# Patient Record
Sex: Female | Born: 1980 | Race: White | Hispanic: No | Marital: Married | Smoking: Never smoker
Health system: Southern US, Community
[De-identification: ages and names within clinical notes are randomized; demographics above are authoritative.]

## PROBLEM LIST (undated history)

## (undated) DIAGNOSIS — Z789 Other specified health status: Secondary | ICD-10-CM

## (undated) HISTORY — PX: MANDIBLE FRACTURE SURGERY: SHX706

## (undated) HISTORY — PX: CHOLECYSTECTOMY: SHX55

---
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
## 2001-12-05 ENCOUNTER — Emergency Department (HOSPITAL_COMMUNITY): Admission: EM | Admit: 2001-12-05 | Discharge: 2001-12-06 | Payer: Self-pay | Admitting: Emergency Medicine

## 2001-12-18 ENCOUNTER — Other Ambulatory Visit: Admission: RE | Admit: 2001-12-18 | Discharge: 2001-12-18 | Payer: Self-pay | Admitting: Obstetrics and Gynecology

## 2002-01-14 ENCOUNTER — Ambulatory Visit (HOSPITAL_COMMUNITY): Admission: RE | Admit: 2002-01-14 | Discharge: 2002-01-14 | Payer: Self-pay | Admitting: Gastroenterology

## 2002-01-14 ENCOUNTER — Encounter: Payer: Self-pay | Admitting: Gastroenterology

## 2002-02-26 ENCOUNTER — Encounter: Payer: Self-pay | Admitting: Surgery

## 2002-02-26 ENCOUNTER — Observation Stay (HOSPITAL_COMMUNITY): Admission: RE | Admit: 2002-02-26 | Discharge: 2002-02-27 | Payer: Self-pay | Admitting: Surgery

## 2002-02-26 ENCOUNTER — Encounter (INDEPENDENT_AMBULATORY_CARE_PROVIDER_SITE_OTHER): Payer: Self-pay | Admitting: Specialist

## 2003-02-01 ENCOUNTER — Emergency Department (HOSPITAL_COMMUNITY): Admission: EM | Admit: 2003-02-01 | Discharge: 2003-02-01 | Payer: Self-pay | Admitting: Emergency Medicine

## 2003-02-02 ENCOUNTER — Emergency Department (HOSPITAL_COMMUNITY): Admission: EM | Admit: 2003-02-02 | Discharge: 2003-02-02 | Payer: Self-pay | Admitting: Emergency Medicine

## 2007-09-02 ENCOUNTER — Encounter: Admission: RE | Admit: 2007-09-02 | Discharge: 2007-09-02 | Payer: Self-pay | Admitting: Obstetrics and Gynecology

## 2010-10-22 NOTE — L&D Delivery Note (Signed)
Delivery Note At 9:22 AM a viable femal was delivered via Vaginal, Spontaneous Delivery in OA Presentation.  APGAR: 9 9 Placenta status delivered spontaneously and intact with a 3 vessel cord.  Anesthesia: Epidural  Episiotomy: none Lacerations: first degree Suture Repair: 3.0 chromic Est. Blood Loss (mL): 300  Mom to postpartum.  Baby to nursery-stable.  Shuaib Corsino L 09/21/2011, 9:32 AM

## 2011-03-09 NOTE — Op Note (Signed)
[facility]  Patient:    Maria Huynh, Maria Huynh Visit Number: [id number] MRN: [id number]          Service Type: SUR Location: [id number] Attending Physician:  Shelly Rubenstein Dictated by:   Abigail Miyamoto, M.D. Proc. Date: 02/26/02 Admit Date:  02/26/2002                             Operative Report  PREOPERATIVE DIAGNOSIS:  Biliary dyskinesia.  POSTOPERATIVE DIAGNOSIS:  Biliary dyskinesia.  OPERATION PERFORMED:  Laparoscopic cholecystectomy with intraoperative cholangiogram.  SURGEON:  Abigail Miyamoto, M.D.  ASSISTANT:  Adolph Pollack, M.D.  ANESTHESIA:  General endotracheal anesthesia.  ESTIMATED BLOOD LOSS:  Minimal.  OPERATIVE FINDINGS:  The patient was found to have a normal cholangiogram.  DESCRIPTION OF PROCEDURE:  Patient brought to operating room and identified as Maria Huynh.  She was placed supine on the operating table and general anesthesia was induced.  Her abdomen was then prepped and draped in the usual sterile fashion.  Using a #15 blade, a small transverse incision was made below the umbilicus.  Incision was carried down through the fascia with the scalpel.  A hemostat was then used to pass into the peritoneal cavity.  A 0 Vicryl pursestring suture was placed around the fascia opening.  The Hasson port was placed through the opening and insufflation of the abdomen was begun. An 11 mm port was then placed in the patients epigastrium and two 5 mm ports were placed in his right flank, all under direct vision.  The gallbladder was then retracted above the liver bed.  Dissection was then carried out at the base.  There was found to be a moderate amount of scarring to the gallbladder. The cystic duct was dissected out and clipped once distally.  It was then partly transected with scissors.  The cholangiocath was inserted through an angiocath in the right upper quadrant.  The cholangiocath was passed down into the cystic  stump.  A cholangiogram was then performed under fluoroscopy.  Good flow of contrast was seen into the common bile duct, biliary radical and duodenum.  The biliary system appeared normal.  At this point the cholangiocath was removed.  The cystic duct was then clipped three times proximally and then completely transected.  The cystic artery and the branch were then identified, clipped twice proximally, once distally and transected as well.  The gallbladder was then dissected free from the liver bed with the electrocautery.  Once the gallbladder was completely excised, it was removed through the incision in the umbilicus.  The 0 Vicryl suture at the umbilicus was then tied in place closing the fascial defect.  The liver bed was then again examined and hemostasis was achieved with the cautery.  The abdomen was then irrigated with normal saline.  All ports were then removed under direct vision and the abdomen was deflated.  All incisions were then anesthetized with 0.25% Marcaine and then closed with 4-0 Monocryl subcuticular sutures. Steri-Strips, gauze and tape were then applied.  The patient tolerated the procedure well.  All, sponge, needle and instrument counts were correct at the end of the procedure.  The patient was then extubated in the operating room and taken in stable condition to the recovery room. Dictated by:   Abigail Miyamoto, M.D. Attending Physician:  Shelly Rubenstein DD:  02/26/02 TD:  02/27/02 Job: [id number]

## 2011-09-11 ENCOUNTER — Encounter (HOSPITAL_COMMUNITY): Payer: Self-pay | Admitting: *Deleted

## 2011-09-11 ENCOUNTER — Inpatient Hospital Stay (HOSPITAL_COMMUNITY)
Admission: AD | Admit: 2011-09-11 | Discharge: 2011-09-11 | Disposition: A | Discharge status: Home / Self Care | Payer: Managed Care, Other (non HMO) | Source: Ambulatory Visit | Attending: Obstetrics and Gynecology | Admitting: Obstetrics and Gynecology

## 2011-09-11 DIAGNOSIS — O479 False labor, unspecified: Secondary | ICD-10-CM | POA: Insufficient documentation

## 2011-09-11 DIAGNOSIS — O4690 Antepartum hemorrhage, unspecified, unspecified trimester: Secondary | ICD-10-CM | POA: Insufficient documentation

## 2011-09-11 HISTORY — DX: Other specified health status: Z78.9

## 2011-09-11 NOTE — Progress Notes (Signed)
Pt states, " I went to the doctor today at 3:50pm and was 1 cm. After I got home  At 6:30 pm I saw blood in my panties and some clots an inch long. I've had contractions on and off for a couple of days."

## 2011-09-20 ENCOUNTER — Encounter (HOSPITAL_COMMUNITY): Payer: Self-pay | Admitting: Anesthesiology

## 2011-09-20 ENCOUNTER — Inpatient Hospital Stay (HOSPITAL_COMMUNITY): Payer: Managed Care, Other (non HMO) | Admitting: Anesthesiology

## 2011-09-20 ENCOUNTER — Inpatient Hospital Stay (HOSPITAL_COMMUNITY)
Admission: AD | Admit: 2011-09-20 | Discharge: 2011-09-23 | DRG: 775 | Disposition: A | Discharge status: Home / Self Care | Payer: Managed Care, Other (non HMO) | Source: Ambulatory Visit | Attending: Obstetrics and Gynecology | Admitting: Obstetrics and Gynecology

## 2011-09-20 ENCOUNTER — Encounter (HOSPITAL_COMMUNITY): Payer: Self-pay

## 2011-09-20 LAB — CBC
HCT: 37.1 % (ref 36.0–46.0)
Hemoglobin: 12.1 g/dL (ref 12.0–15.0)
MCH: 30.4 pg (ref 26.0–34.0)
MCHC: 32.6 g/dL (ref 30.0–36.0)
RDW: 14.6 % (ref 11.5–15.5)

## 2011-09-20 LAB — RPR: RPR: NONREACTIVE

## 2011-09-20 LAB — ANTIBODY SCREEN: Antibody Screen: NEGATIVE

## 2011-09-20 LAB — STREP B DNA PROBE: GBS: NEGATIVE

## 2011-09-20 LAB — RUBELLA ANTIBODY, IGM: Rubella: IMMUNE

## 2011-09-20 MED ORDER — PHENYLEPHRINE 40 MCG/ML (10ML) SYRINGE FOR IV PUSH (FOR BLOOD PRESSURE SUPPORT)
80.0000 ug | PREFILLED_SYRINGE | INTRAVENOUS | Status: DC | PRN
  Filled 2011-09-20: qty 5

## 2011-09-20 MED ORDER — LACTATED RINGERS IV SOLN: 500.0000 mL | INTRAVENOUS | Status: DC | PRN

## 2011-09-20 MED ORDER — ONDANSETRON HCL 4 MG/2ML IJ SOLN: 4.0000 mg | Freq: Four times a day (QID) | INTRAMUSCULAR | Status: DC | PRN

## 2011-09-20 MED ORDER — CITRIC ACID-SODIUM CITRATE 334-500 MG/5ML PO SOLN: 30.0000 mL | ORAL | Status: DC | PRN

## 2011-09-20 MED ORDER — EPHEDRINE 5 MG/ML INJ
10.0000 mg | INTRAVENOUS | Status: DC | PRN
  Filled 2011-09-20: qty 4

## 2011-09-20 MED ORDER — SODIUM BICARBONATE 8.4 % IV SOLN
INTRAVENOUS | Status: DC | PRN
  Administered 2011-09-20: 5 mL via EPIDURAL

## 2011-09-20 MED ORDER — IBUPROFEN 600 MG PO TABS: 600.0000 mg | ORAL_TABLET | Freq: Four times a day (QID) | ORAL | Status: DC | PRN

## 2011-09-20 MED ORDER — OXYTOCIN 20 UNITS IN LACTATED RINGERS INFUSION - SIMPLE
125.0000 mL/h | Freq: Once | INTRAVENOUS | Status: AC
  Administered 2011-09-21: 900 mL/h via INTRAVENOUS

## 2011-09-20 MED ORDER — LIDOCAINE HCL (PF) 1 % IJ SOLN
30.0000 mL | INTRAMUSCULAR | Status: DC | PRN
  Filled 2011-09-20: qty 30

## 2011-09-20 MED ORDER — DIPHENHYDRAMINE HCL 50 MG/ML IJ SOLN: 12.5000 mg | INTRAMUSCULAR | Status: DC | PRN

## 2011-09-20 MED ORDER — EPHEDRINE 5 MG/ML INJ: 10.0000 mg | INTRAVENOUS | Status: DC | PRN

## 2011-09-20 MED ORDER — PHENYLEPHRINE 40 MCG/ML (10ML) SYRINGE FOR IV PUSH (FOR BLOOD PRESSURE SUPPORT): 80.0000 ug | PREFILLED_SYRINGE | INTRAVENOUS | Status: DC | PRN

## 2011-09-20 MED ORDER — OXYTOCIN BOLUS FROM INFUSION
500.0000 mL | Freq: Once | INTRAVENOUS | Status: DC
  Filled 2011-09-20: qty 500
  Filled 2011-09-20: qty 1000

## 2011-09-20 MED ORDER — LACTATED RINGERS IV SOLN
INTRAVENOUS | Status: DC
  Administered 2011-09-20: 17:00:00 via INTRAVENOUS

## 2011-09-20 MED ORDER — FLEET ENEMA 7-19 GM/118ML RE ENEM: 1.0000 | ENEMA | RECTAL | Status: DC | PRN

## 2011-09-20 MED ORDER — LACTATED RINGERS IV SOLN
500.0000 mL | Freq: Once | INTRAVENOUS | Status: AC
  Administered 2011-09-20: 500 mL via INTRAVENOUS

## 2011-09-20 MED ORDER — ACETAMINOPHEN 325 MG PO TABS: 650.0000 mg | ORAL_TABLET | ORAL | Status: DC | PRN

## 2011-09-20 MED ORDER — OXYCODONE-ACETAMINOPHEN 5-325 MG PO TABS: 2.0000 | ORAL_TABLET | ORAL | Status: DC | PRN

## 2011-09-20 MED ORDER — FENTANYL 2.5 MCG/ML BUPIVACAINE 1/10 % EPIDURAL INFUSION (WH - ANES)
14.0000 mL/h | INTRAMUSCULAR | Status: DC
  Administered 2011-09-21 (×3): 14 mL/h via EPIDURAL
  Filled 2011-09-20 (×4): qty 60

## 2011-09-20 MED ORDER — BUTORPHANOL TARTRATE 2 MG/ML IJ SOLN: 1.0000 mg | INTRAMUSCULAR | Status: DC | PRN

## 2011-09-20 MED ORDER — OXYTOCIN 20 UNITS IN LACTATED RINGERS INFUSION - SIMPLE
1.0000 m[IU]/min | INTRAVENOUS | Status: DC
  Administered 2011-09-20: 1 m[IU]/min via INTRAVENOUS

## 2011-09-20 MED ORDER — TERBUTALINE SULFATE 1 MG/ML IJ SOLN: 0.2500 mg | Freq: Once | INTRAMUSCULAR | Status: AC | PRN

## 2011-09-20 MED ORDER — FENTANYL 2.5 MCG/ML BUPIVACAINE 1/10 % EPIDURAL INFUSION (WH - ANES)
INTRAMUSCULAR | Status: DC | PRN
  Administered 2011-09-20: 14 mL/h via EPIDURAL

## 2011-09-20 NOTE — H&P (Signed)
Maria Huynh is a 30 y.o. female presenting for SRjOM clear about noon followed by UCs. No HA, vision change or epigastric pain. Maternal Medical History:  Reason for admission: Reason for admission: rupture of membranes.  Contractions: Onset was 3-5 hours ago.    Fetal activity: Perceived fetal activity is normal.      OB History    Grav Para Term Preterm Abortions TAB SAB Ect Mult Living   1              Past Medical History  Diagnosis Date  . No pertinent past medical history    Past Surgical History  Procedure Date  . Cholecystectomy    Family History: family history includes Hypertension in her mother and Kidney disease in her mother. Social History:  reports that she has never smoked. She has never used smokeless tobacco. She reports that she does not drink alcohol or use illicit drugs.  Review of Systems  Eyes: Negative for blurred vision.  Gastrointestinal: Negative for abdominal pain.  Neurological: Negative for headaches.    Dilation: 3 Effacement (%): 50 Station: -2 Exam by:: L. MCDaniel RN Blood pressure 118/74, pulse 105, temperature 98.1 F (36.7 C), temperature source Oral, resp. rate 18, height 5\' 5"  (1.651 m), weight 90.266 kg (199 lb). Maternal Exam:  Uterine Assessment: Contraction strength is moderate.  Contraction frequency is irregular.      Fetal Exam Fetal Monitor Review: Pattern: accelerations present.       Physical Exam  Cardiovascular: Normal rate and regular rhythm.   Respiratory: Effort normal and breath sounds normal.  GI: There is no tenderness.  Neurological: She has normal reflexes.    Prenatal labs: ABO, Rh: A/Negative/-- (11/29 0000) Antibody: Negative (11/29 0000) Rubella: Immune (11/29 0000) RPR: Nonreactive (11/29 0000)  HBsAg: Negative (11/29 0000)  HIV: Non-reactive (11/29 0000)  GBS: Negative (11/29 0000)   Assessment/Plan: 30 yo G1P0 at 53 3/7 weeks with SROM and UCs about q3-5 min. Pitocin now running.  D/W pt labor augmentation, risks/benefits.   Shaquetta Arcos II,Tannon Peerson E 09/20/2011, 5:09 PM

## 2011-09-20 NOTE — Anesthesia Preprocedure Evaluation (Signed)

## 2011-09-20 NOTE — ED Notes (Signed)
Dr. Henderson Cloud notified pt +SROM at 1200 verified by ferning slide, clear fluid, rates pain 4/10. G1, per pt GBS negative, EFM tracing reactive, ctx's q58minutes. Dr. Henderson Cloud to place orders from MD office for admission.

## 2011-09-20 NOTE — Anesthesia Procedure Notes (Signed)

## 2011-09-20 NOTE — Progress Notes (Signed)
Pt states ?SROm at 1200 today, clear fluid, G1, was 2cm in office Tuesday, +FM, lower back pain and vaginal pain rates 4/10.

## 2011-09-21 ENCOUNTER — Encounter (HOSPITAL_COMMUNITY): Payer: Self-pay | Admitting: *Deleted

## 2011-09-21 MED ORDER — MEDROXYPROGESTERONE ACETATE 150 MG/ML IM SUSP: 150.0000 mg | INTRAMUSCULAR | Status: DC | PRN

## 2011-09-21 MED ORDER — OXYCODONE-ACETAMINOPHEN 5-325 MG PO TABS: 1.0000 | ORAL_TABLET | ORAL | Status: DC | PRN

## 2011-09-21 MED ORDER — WITCH HAZEL-GLYCERIN EX PADS: 1.0000 "application " | MEDICATED_PAD | CUTANEOUS | Status: DC | PRN

## 2011-09-21 MED ORDER — IBUPROFEN 600 MG PO TABS
600.0000 mg | ORAL_TABLET | Freq: Four times a day (QID) | ORAL | Status: DC
  Administered 2011-09-21 – 2011-09-23 (×7): 600 mg via ORAL
  Filled 2011-09-21 (×7): qty 1

## 2011-09-21 MED ORDER — SENNOSIDES-DOCUSATE SODIUM 8.6-50 MG PO TABS
2.0000 | ORAL_TABLET | Freq: Every day | ORAL | Status: DC
  Administered 2011-09-21 – 2011-09-22 (×2): 2 via ORAL

## 2011-09-21 MED ORDER — DIBUCAINE 1 % RE OINT
1.0000 "application " | TOPICAL_OINTMENT | RECTAL | Status: DC | PRN
  Filled 2011-09-21: qty 28

## 2011-09-21 MED ORDER — BENZOCAINE-MENTHOL 20-0.5 % EX AERO
1.0000 "application " | INHALATION_SPRAY | CUTANEOUS | Status: DC | PRN
  Administered 2011-09-21: 1 via TOPICAL
  Filled 2011-09-21: qty 56

## 2011-09-21 MED ORDER — MEASLES, MUMPS & RUBELLA VAC ~~LOC~~ INJ
0.5000 mL | INJECTION | Freq: Once | SUBCUTANEOUS | Status: DC
  Filled 2011-09-21: qty 0.5

## 2011-09-21 MED ORDER — DIPHENHYDRAMINE HCL 25 MG PO CAPS: 25.0000 mg | ORAL_CAPSULE | Freq: Four times a day (QID) | ORAL | Status: DC | PRN

## 2011-09-21 MED ORDER — SIMETHICONE 80 MG PO CHEW: 80.0000 mg | CHEWABLE_TABLET | ORAL | Status: DC | PRN

## 2011-09-21 MED ORDER — ONDANSETRON HCL 4 MG PO TABS: 4.0000 mg | ORAL_TABLET | ORAL | Status: DC | PRN

## 2011-09-21 MED ORDER — LANOLIN HYDROUS EX OINT: TOPICAL_OINTMENT | CUTANEOUS | Status: DC | PRN

## 2011-09-21 MED ORDER — BISACODYL 10 MG RE SUPP
10.0000 mg | Freq: Every day | RECTAL | Status: DC | PRN
  Filled 2011-09-21: qty 1

## 2011-09-21 MED ORDER — BENZOCAINE-MENTHOL 20-0.5 % EX AERO
INHALATION_SPRAY | CUTANEOUS | Status: AC
  Administered 2011-09-21: 1 via TOPICAL
  Filled 2011-09-21: qty 56

## 2011-09-21 MED ORDER — TETANUS-DIPHTH-ACELL PERTUSSIS 5-2.5-18.5 LF-MCG/0.5 IM SUSP
0.5000 mL | Freq: Once | INTRAMUSCULAR | Status: DC
  Filled 2011-09-21: qty 0.5

## 2011-09-21 MED ORDER — ZOLPIDEM TARTRATE 5 MG PO TABS: 5.0000 mg | ORAL_TABLET | Freq: Every evening | ORAL | Status: DC | PRN

## 2011-09-21 MED ORDER — ONDANSETRON HCL 4 MG/2ML IJ SOLN: 4.0000 mg | INTRAMUSCULAR | Status: DC | PRN

## 2011-09-21 MED ORDER — FLEET ENEMA 7-19 GM/118ML RE ENEM: 1.0000 | ENEMA | Freq: Every day | RECTAL | Status: DC | PRN

## 2011-09-21 MED ORDER — PRENATAL PLUS 27-1 MG PO TABS
1.0000 | ORAL_TABLET | Freq: Every day | ORAL | Status: DC
  Administered 2011-09-22 – 2011-09-23 (×2): 1 via ORAL
  Filled 2011-09-21 (×2): qty 1

## 2011-09-21 NOTE — Progress Notes (Signed)
FHT sleep pattern now, 10-15 BPM response to scalp stim UCs q 3 min Cx ant rim from 9:00 to 12:00/ 0 station

## 2011-09-21 NOTE — Anesthesia Postprocedure Evaluation (Signed)
Anesthesia Post Note  Patient: Maria Huynh  Procedure(s) Performed: * No procedures listed *  Anesthesia type: Epidural  Patient location: Mother/Baby  Post pain: Pain level controlled  Post assessment: Post-op Vital signs reviewed  Last Vitals:  Filed Vitals:   09/21/11 1345  BP: 117/71  Pulse: 127  Temp: 37.7 C  Resp: 18    Post vital signs: Reviewed  Level of consciousness:alert  Complications: No apparent anesthesia complications

## 2011-09-21 NOTE — Progress Notes (Signed)
Patient is pushing now  Exam c/c/+1 some caput Continue pushing

## 2011-09-22 LAB — CBC
MCHC: 31.9 g/dL (ref 30.0–36.0)
RDW: 15 % (ref 11.5–15.5)
WBC: 18.8 10*3/uL — ABNORMAL HIGH (ref 4.0–10.5)

## 2011-09-22 MED ORDER — RHO D IMMUNE GLOBULIN 1500 UNIT/2ML IJ SOLN
300.0000 ug | Freq: Once | INTRAMUSCULAR | Status: AC
  Administered 2011-09-22: 300 ug via INTRAMUSCULAR
  Filled 2011-09-22: qty 2

## 2011-09-22 NOTE — Progress Notes (Signed)
Post Partum Day 1 Subjective: no complaints, up ad lib, voiding, tolerating PO and + flatus  Objective: Blood pressure 110/73, pulse 101, temperature 98.2 F (36.8 C), temperature source Oral, resp. rate 18, height 5\' 5"  (1.651 m), weight 90.266 kg (199 lb), SpO2 94.00%, unknown if currently breastfeeding.  Physical Exam:  General: alert, cooperative and appears stated age Lochia: appropriate Uterine Fundus: firm Incision: not applicable DVT Evaluation: No evidence of DVT seen on physical exam.   Basename 09/22/11 0537 09/20/11 1620  HGB 9.5* 12.1  HCT 29.8* 37.1    Assessment/Plan: Plan for discharge tomorrow   LOS: 2 days   Maria Huynh L 09/22/2011, 10:28 AM

## 2011-09-23 LAB — RH IG WORKUP (INCLUDES ABO/RH)
ABO/RH(D): A NEG
Antibody Screen: NEGATIVE
Gestational Age(Wks): 38.4

## 2011-09-23 MED ORDER — BENZOCAINE-MENTHOL 20-0.5 % EX AERO
INHALATION_SPRAY | CUTANEOUS | Status: AC
  Filled 2011-09-23: qty 56

## 2011-09-23 NOTE — Discharge Summary (Signed)
Obstetric Discharge Summary Reason for Admission: onset of labor Prenatal Procedures: none Intrapartum Procedures: spontaneous vaginal delivery Postpartum Procedures: none Complications-Operative and Postpartum: first degree perineal laceration Hemoglobin  Date Value Range Status  09/22/2011 9.5* 12.0-15.0 (g/dL) Final     DELTA CHECK NOTED     REPEATED TO VERIFY     HCT  Date Value Range Status  09/22/2011 29.8* 36.0-46.0 (%) Final    Discharge Diagnoses: Term Pregnancy-delivered  Discharge Information: Date: 09/23/2011 Activity: pelvic rest Diet: routine Medications: None Condition: improved Instructions: refer to practice specific booklet Discharge to: home   Newborn Data: Live born female  Birth Weight: 7 lb 13.4 oz (3555 g) APGAR: 9, 9  Home with mother.  Maria Huynh L 09/23/2011, 8:28 AM

## 2011-09-23 NOTE — Progress Notes (Signed)
Post Partum Day 2 Subjective: no complaints, up ad lib, voiding, tolerating PO and + flatus  Objective: Blood pressure 119/79, pulse 96, temperature 97.7 F (36.5 C), temperature source Oral, resp. rate 18, height 5\' 5"  (1.651 m), weight 90.266 kg (199 lb), SpO2 94.00%, unknown if currently breastfeeding.  Physical Exam:  General: alert Lochia: appropriate Uterine Fundus: firm Incision: healing well DVT Evaluation: No evidence of DVT seen on physical exam.   Basename 09/22/11 0537 09/20/11 1620  HGB 9.5* 12.1  HCT 29.8* 37.1    Assessment/Plan: Discharge home   LOS: 3 days   Kasi Lasky L 09/23/2011, 8:27 AM

## 2012-06-15 ENCOUNTER — Ambulatory Visit (INDEPENDENT_AMBULATORY_CARE_PROVIDER_SITE_OTHER): Payer: Managed Care, Other (non HMO) | Admitting: Family Medicine

## 2012-06-15 ENCOUNTER — Ambulatory Visit: Payer: Managed Care, Other (non HMO)

## 2012-06-15 VITALS — BP 115/69 | HR 72 | Temp 97.9°F | Resp 16 | Ht 65.5 in | Wt 160.0 lb

## 2012-06-15 DIAGNOSIS — M25532 Pain in left wrist: Secondary | ICD-10-CM

## 2012-06-15 DIAGNOSIS — M25539 Pain in unspecified wrist: Secondary | ICD-10-CM

## 2012-06-15 MED ORDER — HYDROCODONE-ACETAMINOPHEN 5-500 MG PO TABS: 1.0000 | ORAL_TABLET | Freq: Three times a day (TID) | ORAL | Status: AC | PRN

## 2012-06-15 NOTE — Progress Notes (Signed)
  31 yo woman who F.O.O.S.H. About 1/2 hour PTA.  Pain immediate and persistent in left radius and wrist areas. Denies possibility of pregnancy.   Objective:  Left wrist is tender over distal radius and scaphoid bones.  No significant swelling or ecchymosis. No bony deformity.  Unable to move wrist easily secondary to pain.  Abrasions on proximal palm which are washed and a dressing applied. [facility] reading (PRIMARY) by  Dr. Milus Glazier:  Left wrist-question of impaction fx on distal radius  Assessment:  Possible impaction fx with abrasions  Plan: Dressing for 12-24 hours Splint with thumb spica vicodin Follow up 3 days  1. Wrist pain, left  DG Wrist Complete Left

## 2012-06-18 ENCOUNTER — Ambulatory Visit (INDEPENDENT_AMBULATORY_CARE_PROVIDER_SITE_OTHER): Payer: Managed Care, Other (non HMO) | Admitting: Family Medicine

## 2012-06-18 VITALS — BP 90/66 | HR 68 | Temp 97.8°F | Resp 20 | Ht 65.0 in | Wt 159.2 lb

## 2012-06-18 DIAGNOSIS — S63509A Unspecified sprain of unspecified wrist, initial encounter: Secondary | ICD-10-CM

## 2012-06-18 DIAGNOSIS — I959 Hypotension, unspecified: Secondary | ICD-10-CM

## 2012-06-18 MED ORDER — OXAPROZIN 600 MG PO TABS: ORAL_TABLET | ORAL | Status: DC

## 2012-06-18 NOTE — Patient Instructions (Addendum)
Take medications as ordered. Wear the splint until the pain is substantially improved.  Return in one week if pain continues to persist.

## 2012-06-18 NOTE — Progress Notes (Signed)
Subjective: Patient is here to followup on her wrist which continues to hurt her. She's been wearing the splint. Takes occasional pain medication. No other major complaints. She is set not lightheaded today, this is the lowest her blood pressures run.  Objective: Pleasant alert lady in no acute distress. Chest clear. Heart regular without murmurs. She is active and alert.  Left wrist is in a splint. When it is removed she is tender both on anterior and posterior aspect of the she has pain when she squeezes her hand.   Assessment: Sprain left wrist with abrasion of hand Hypertension  Plan: Drink lots of fluids and a little more salt. If she gets lightheaded she is to return. Wear the splint. Take Daypro 600 twice a day. Return in one week for repeat x-ray 50 to persist in hurting. Neck

## 2013-06-17 ENCOUNTER — Emergency Department (HOSPITAL_COMMUNITY)
Admission: EM | Admit: 2013-06-17 | Discharge: 2013-06-17 | Disposition: A | Discharge status: Home / Self Care | Payer: Managed Care, Other (non HMO) | Attending: Emergency Medicine | Admitting: Emergency Medicine

## 2013-06-17 ENCOUNTER — Encounter (HOSPITAL_COMMUNITY): Payer: Self-pay | Admitting: Emergency Medicine

## 2013-06-17 ENCOUNTER — Emergency Department (INDEPENDENT_AMBULATORY_CARE_PROVIDER_SITE_OTHER)
Admission: EM | Admit: 2013-06-17 | Discharge: 2013-06-17 | Disposition: A | Discharge status: Home / Self Care | Payer: Managed Care, Other (non HMO) | Source: Home / Self Care | Attending: Emergency Medicine | Admitting: Emergency Medicine

## 2013-06-17 ENCOUNTER — Emergency Department (HOSPITAL_COMMUNITY): Payer: Managed Care, Other (non HMO)

## 2013-06-17 DIAGNOSIS — R079 Chest pain, unspecified: Secondary | ICD-10-CM

## 2013-06-17 DIAGNOSIS — R072 Precordial pain: Secondary | ICD-10-CM | POA: Insufficient documentation

## 2013-06-17 DIAGNOSIS — R0602 Shortness of breath: Secondary | ICD-10-CM | POA: Insufficient documentation

## 2013-06-17 LAB — BASIC METABOLIC PANEL
BUN: 9 mg/dL (ref 6–23)
CO2: 22 mEq/L (ref 19–32)
Chloride: 105 mEq/L (ref 96–112)
Creatinine, Ser: 0.71 mg/dL (ref 0.50–1.10)

## 2013-06-17 LAB — CBC
HCT: 43 % (ref 36.0–46.0)
MCV: 94.9 fL (ref 78.0–100.0)
RBC: 4.53 MIL/uL (ref 3.87–5.11)
RDW: 13.5 % (ref 11.5–15.5)
WBC: 9.2 10*3/uL (ref 4.0–10.5)

## 2013-06-17 MED ORDER — ASPIRIN 81 MG PO CHEW
324.0000 mg | CHEWABLE_TABLET | Freq: Once | ORAL | Status: AC
  Administered 2013-06-17: 324 mg via ORAL
  Filled 2013-06-17: qty 4

## 2013-06-17 MED ORDER — KETOROLAC TROMETHAMINE 30 MG/ML IJ SOLN
30.0000 mg | Freq: Once | INTRAMUSCULAR | Status: AC
  Administered 2013-06-17: 30 mg via INTRAVENOUS
  Filled 2013-06-17: qty 1

## 2013-06-17 NOTE — ED Notes (Signed)
PT. TRANSFERRED FROM [facility] FOR FURTHER EVALUATION OF INTERMITTENT MID CHEST PAIN FOR 1 WEEK WITH SLIGHT SOB , DENIES COUGH /NAUSEA OR DIAPHORESIS .

## 2013-06-17 NOTE — ED Notes (Signed)
Chest tightness.  See nurses note from registration

## 2013-06-17 NOTE — ED Notes (Signed)
Spoke to patient at discharge desk.  Patient complains of chest pain for one week, today has been the majority of the day, no nausea, no vomiting, no diaphoresis.  Reports sob.  Pain is center chest, tight as described by patient.  Denies any uri.  Does say there is sob.  Last week her chest pain was sharp and hurt with movement.

## 2013-06-17 NOTE — ED Provider Notes (Signed)
CSN: [id number]     Arrival date & time 06/17/13  1916 History   First MD Initiated Contact with Patient 06/17/13 1936     Chief Complaint  Patient presents with  . Chest Pain   (Consider location/radiation/quality/duration/timing/severity/associated sxs/prior Treatment) HPI Comments: Patient transferred here today from [facility] due to chest pain.  She reports that the chest pain has been intermittent over the past week, but has been constant since noon today.  Pain is substernal and does not radiate.  She reports that she was not doing anything exertional at the onset of the pain.  Pain not worse with exertion or eating.  She states that she has never had pain like this before.  She states that she did have some mild SOB today, but no shortness of breath previously.  She denies fever, chills, cough, nausea, vomiting, lower extremity edema, syncope, numbness, or tingling.  She denies prior history of DVT or PE.  She is currently not on any oral contraceptives.  Denies recent surgery in the past 4 weeks.  She reports that she did drive from [place] (approximately 10 hours) three weeks ago.  She denies any lower extremity pain or edema.  She denies any history of HTN, Hyperlipidemia, or DM.  Patient is a 32 y.o. female presenting with chest pain. The history is provided by the patient.  Chest Pain Associated symptoms: shortness of breath     Past Medical History  Diagnosis Date  . No pertinent past medical history    Past Surgical History  Procedure Laterality Date  . Cholecystectomy    . Mandible fracture surgery     Family History  Problem Relation Age of Onset  . Hypertension Mother   . Kidney disease Mother    History  Substance Use Topics  . Smoking status: Never Smoker   . Smokeless tobacco: Never Used  . Alcohol Use: No   OB History   Grav Para Term Preterm Abortions TAB SAB Ect Mult Living   1 1 1       1      Review of Systems  Respiratory: Positive for shortness of breath.    Cardiovascular: Positive for chest pain.  All other systems reviewed and are negative.    Allergies  Review of patient's allergies indicates no known allergies.  Home Medications  No current outpatient prescriptions on file. BP 107/67  Pulse 71  Temp(Src) 97.9 F (36.6 C) (Oral)  Resp 16  SpO2 100%  LMP 06/17/2013 Physical Exam  Nursing note and vitals reviewed. Constitutional: She appears well-developed and well-nourished.  HENT:  Head: Normocephalic and atraumatic.  Mouth/Throat: Oropharynx is clear and moist.  Neck: Normal range of motion. Neck supple.  Cardiovascular: Normal rate, regular rhythm and normal heart sounds.   Pulmonary/Chest: Effort normal and breath sounds normal. No respiratory distress. She has no wheezes. She has no rales.  Abdominal: Soft. She exhibits no distension and no mass. There is no tenderness. There is no rebound and no guarding.  Musculoskeletal: Normal range of motion.  Neurological: She is alert.  Skin: Skin is warm and dry. No rash noted. No erythema.  Psychiatric: She has a normal mood and affect.    ED Course  Procedures (including critical care time) Labs Review Labs Reviewed  CBC  BASIC METABOLIC PANEL  PRO B NATRIURETIC PEPTIDE  D-DIMER, QUANTITATIVE   Imaging Review Dg Chest 2 View  06/17/2013   *RADIOLOGY REPORT*  Clinical Data: Mid chest pain for 1 week, shortness of  breath  CHEST - 2 VIEW  Comparison: None.  Findings:  Normal cardiac silhouette and mediastinal contours.  The lungs appear hyperexpanded.  No focal airspace opacity.  No pleural effusion or pneumothorax.  No evidence of edema.  No acute osseous abnormality.  Post cholecystectomy.  IMPRESSION: Mild lung hyperexpansion without acute cardiopulmonary disease.   Original Report Authenticated By: Tacey Ruiz, MD     Date: 06/17/2013  Rate: 71  Rhythm: normal sinus rhythm  QRS Axis: normal  Intervals: normal  ST/T Wave abnormalities: nonspecific T wave changes   Conduction Disutrbances:none  Narrative Interpretation:   Old EKG Reviewed: none available  10:15 PM Patient stating that her chest pain has improved after given Toradol and Aspirin.  MDM  No diagnosis found. Patient presenting with a chief complaint of chest pain that has been intermittent over the past week.  CXR negative.  D-dimer negative.  EKG unremarkable.  Troponin negative.  Pain improved after given Toradol.  Feel that the patient is stable for discharge.  Patient instructed to follow up with PCP.  Return precautions given.    Pascal Lux Asher, PA-C 06/18/13 0120

## 2013-06-17 NOTE — ED Notes (Signed)
MD at bedside. 

## 2013-06-17 NOTE — ED Provider Notes (Signed)
Medical screening examination/treatment/procedure(s) were performed by resident-physician practitioner and as supervising physician I was immediately available for consultation/collaboration.  Raynald Blend, MD 06/17/13 2012

## 2013-06-17 NOTE — ED Notes (Signed)
Patient in radiology

## 2013-06-17 NOTE — ED Provider Notes (Signed)
CSN: [id number]     Arrival date & time 06/17/13  1753 History   First MD Initiated Contact with Patient 06/17/13 1818     Chief Complaint  Patient presents with  . Chest Pain   (Consider location/radiation/quality/duration/timing/severity/associated sxs/prior Treatment) HPI Patient is healthy 32 yo F presenting to [facility] with substernal chest pain x 1 week, progressively worsening. Today, she states the pain was constant which is a change. Previously the pain would come/go. Pain is worse with lying flat. She reports SOB today as well. No nausea, weakness, no sweatiness, no fevers. Not on OCP. She traveled to [place] by car 3 weeks ago. Never had anything like this before. No recent colds or illnesses.  On review of history, she reports mom has had coronary stents placed. She personally does not have DM, HTN, or HLD. Does not smoke tobacco. No new exercise programs. No heavy lifting, no moving.   Past Medical History  Diagnosis Date  . No pertinent past medical history    Past Surgical History  Procedure Laterality Date  . Cholecystectomy     Family History  Problem Relation Age of Onset  . Hypertension Mother   . Kidney disease Mother    History  Substance Use Topics  . Smoking status: Never Smoker   . Smokeless tobacco: Never Used  . Alcohol Use: No   OB History   Grav Para Term Preterm Abortions TAB SAB Ect Mult Living   1 1 1       1      Review of Systems  Constitutional: Positive for appetite change and fatigue. Negative for fever and chills.  HENT: Negative for congestion.   Eyes: Negative for visual disturbance.  Respiratory: Positive for shortness of breath. Negative for cough.   Cardiovascular: Positive for chest pain. Negative for palpitations and leg swelling.  Gastrointestinal: Negative for abdominal pain.  Genitourinary: Negative for dysuria.  Musculoskeletal: Negative for myalgias, back pain and arthralgias.  Skin: Negative for rash.  Neurological:  Negative for headaches.    Allergies  Review of patient's allergies indicates no known allergies.  Home Medications   Current Outpatient Rx  Name  Route  Sig  Dispense  Refill  . oxaprozin (DAYPRO) 600 MG tablet      Take one twice daily with food for inflammation and pain   30 tablet   0   . prenatal vitamin w/FE, FA (NATACHEW) 29-1 MG CHEW   Oral   Chew 1 tablet by mouth daily.            BP 113/76  Pulse 70  Temp(Src) 98 F (36.7 C) (Oral)  Resp 18  SpO2 100%  LMP 06/17/2013 Physical Exam  Vitals reviewed. Constitutional: She appears well-developed and well-nourished.  Appears uncomfortable  HENT:  Head: Normocephalic and atraumatic.  Mouth/Throat: Oropharynx is clear and moist.  Neck: Normal range of motion.  Cardiovascular: Normal rate, regular rhythm and normal heart sounds.   Pulmonary/Chest: Effort normal and breath sounds normal. She has no wheezes. She exhibits tenderness (Mild TTP of sternum. No increased pain with shoulder movement or position).  Abdominal: Soft. There is no tenderness.  Musculoskeletal: Normal range of motion. She exhibits no edema and no tenderness (Neg Homan's).  Lymphadenopathy:    She has no cervical adenopathy.  Neurological: She is alert. No cranial nerve deficit.  Skin: Skin is dry. No rash noted. She is not diaphoretic.    ED Course  Procedures (including critical care time) Labs Review Labs Reviewed -  No data to display Imaging Review No results found.  EKG: Rate 70. PR 154. QRS 86. [id number]. Axis abnormal (132, 133, 121). Inverted T waves in I, aVL, V1 and ?V2. Read as posterior fascicular block. No previous EKG for comparision.  MDM   1. Chest pain    32 yo F with chest pain - EKG not normal. No old EKG for comparison - Pain has been persistent without a clear cause. - Stable for transfer to [facility] via shuttle for further evaluation. Patient agrees    Hilarie Fredrickson, MD 06/17/13 [phone]

## 2013-06-18 NOTE — ED Provider Notes (Signed)
Medical screening examination/treatment/procedure(s) were conducted as a shared visit with non-physician practitioner(s) and myself.  I personally evaluated the patient during the encounter  Patient presents with chest pain.  Low risk for ACS and PE.  D-dimer neg.  EKG with flipped T-wave in V1 but otherwise, wnl.  Patient's pain improved with toradol.    After history, exam, and medical workup I feel the patient has been appropriately medically screened and is safe for discharge home. Pertinent diagnoses were discussed with the patient. Patient was given return precautions.  Shon Baton, MD 06/18/13 1320

## 2014-08-23 ENCOUNTER — Encounter (HOSPITAL_COMMUNITY): Payer: Self-pay | Admitting: Emergency Medicine

## 2015-08-02 ENCOUNTER — Other Ambulatory Visit: Payer: Self-pay | Admitting: Nurse Practitioner

## 2015-08-02 ENCOUNTER — Other Ambulatory Visit (HOSPITAL_COMMUNITY)
Admission: RE | Admit: 2015-08-02 | Discharge: 2015-08-02 | Disposition: A | Discharge status: Home / Self Care | Payer: Managed Care, Other (non HMO) | Source: Ambulatory Visit | Attending: Nurse Practitioner | Admitting: Nurse Practitioner

## 2015-08-02 DIAGNOSIS — Z01419 Encounter for gynecological examination (general) (routine) without abnormal findings: Secondary | ICD-10-CM | POA: Insufficient documentation

## 2015-08-02 DIAGNOSIS — Z1151 Encounter for screening for human papillomavirus (HPV): Secondary | ICD-10-CM | POA: Diagnosis present

## 2015-08-02 DIAGNOSIS — Z113 Encounter for screening for infections with a predominantly sexual mode of transmission: Secondary | ICD-10-CM | POA: Insufficient documentation

## 2015-08-04 LAB — CYTOLOGY - PAP

## 2016-01-29 ENCOUNTER — Emergency Department (HOSPITAL_COMMUNITY)
Admission: EM | Admit: 2016-01-29 | Discharge: 2016-01-30 | Disposition: A | Discharge status: Home / Self Care | Payer: Managed Care, Other (non HMO) | Attending: Emergency Medicine | Admitting: Emergency Medicine

## 2016-01-29 ENCOUNTER — Encounter (HOSPITAL_COMMUNITY): Payer: Self-pay | Admitting: Emergency Medicine

## 2016-01-29 DIAGNOSIS — Z9049 Acquired absence of other specified parts of digestive tract: Secondary | ICD-10-CM | POA: Insufficient documentation

## 2016-01-29 DIAGNOSIS — Z3202 Encounter for pregnancy test, result negative: Secondary | ICD-10-CM | POA: Diagnosis not present

## 2016-01-29 DIAGNOSIS — R112 Nausea with vomiting, unspecified: Secondary | ICD-10-CM | POA: Diagnosis present

## 2016-01-29 DIAGNOSIS — R1084 Generalized abdominal pain: Secondary | ICD-10-CM | POA: Diagnosis not present

## 2016-01-29 LAB — COMPREHENSIVE METABOLIC PANEL
ALBUMIN: 3.9 g/dL (ref 3.5–5.0)
ALT: 53 U/L (ref 14–54)
AST: 30 U/L (ref 15–41)
Alkaline Phosphatase: 99 U/L (ref 38–126)
Anion gap: 7 (ref 5–15)
BILIRUBIN TOTAL: 0.2 mg/dL — AB (ref 0.3–1.2)
BUN: 12 mg/dL (ref 6–20)
CO2: 22 mmol/L (ref 22–32)
Calcium: 9.1 mg/dL (ref 8.9–10.3)
Chloride: 113 mmol/L — ABNORMAL HIGH (ref 101–111)
Creatinine, Ser: 0.73 mg/dL (ref 0.44–1.00)
GFR calc Af Amer: >60 mL/min (ref >60)
GFR calc non Af Amer: >60 mL/min (ref >60)
GLUCOSE: 102 mg/dL — AB (ref 65–99)
POTASSIUM: 3.4 mmol/L — AB (ref 3.5–5.1)
Sodium: 142 mmol/L (ref 135–145)
TOTAL PROTEIN: 6.9 g/dL (ref 6.5–8.1)

## 2016-01-29 LAB — CBC
HEMATOCRIT: 45.7 % (ref 36.0–46.0)
Hemoglobin: 15.5 g/dL — ABNORMAL HIGH (ref 12.0–15.0)
MCH: 32 pg (ref 26.0–34.0)
MCHC: 33.9 g/dL (ref 30.0–36.0)
MCV: 94.2 fL (ref 78.0–100.0)
Platelets: 389 10*3/uL (ref 150–400)
RBC: 4.85 MIL/uL (ref 3.87–5.11)
RDW: 13.9 % (ref 11.5–15.5)
WBC: 7.1 10*3/uL (ref 4.0–10.5)

## 2016-01-29 LAB — LIPASE, BLOOD: Lipase: 44 U/L (ref 11–51)

## 2016-01-29 LAB — URINALYSIS, ROUTINE W REFLEX MICROSCOPIC
BILIRUBIN URINE: NEGATIVE
GLUCOSE, UA: NEGATIVE mg/dL
Hgb urine dipstick: NEGATIVE
KETONES UR: NEGATIVE mg/dL
Leukocytes, UA: NEGATIVE
NITRITE: NEGATIVE
PH: 5.5 (ref 5.0–8.0)
Protein, ur: NEGATIVE mg/dL
Specific Gravity, Urine: 1.041 — ABNORMAL HIGH (ref 1.005–1.030)

## 2016-01-29 LAB — I-STAT BETA HCG BLOOD, ED (MC, WL, AP ONLY): I-stat hCG, quantitative: <5 m[IU]/mL (ref <5)

## 2016-01-29 MED ORDER — PROCHLORPERAZINE EDISYLATE 5 MG/ML IJ SOLN
10.0000 mg | Freq: Once | INTRAMUSCULAR | Status: AC
  Administered 2016-01-29: 10 mg via INTRAVENOUS
  Filled 2016-01-29: qty 2

## 2016-01-29 MED ORDER — ONDANSETRON HCL 4 MG/2ML IJ SOLN
4.0000 mg | Freq: Once | INTRAMUSCULAR | Status: AC
  Administered 2016-01-29: 4 mg via INTRAVENOUS
  Filled 2016-01-29: qty 2

## 2016-01-29 MED ORDER — SODIUM CHLORIDE 0.9 % IV BOLUS (SEPSIS)
1000.0000 mL | Freq: Once | INTRAVENOUS | Status: AC
  Administered 2016-01-29: 1000 mL via INTRAVENOUS

## 2016-01-29 MED ORDER — PROMETHAZINE HCL 25 MG PO TABS: 25.0000 mg | ORAL_TABLET | Freq: Four times a day (QID) | ORAL | Status: DC | PRN

## 2016-01-29 MED ORDER — MORPHINE SULFATE (PF) 4 MG/ML IV SOLN
4.0000 mg | Freq: Once | INTRAVENOUS | Status: AC
  Administered 2016-01-29: 4 mg via INTRAVENOUS
  Filled 2016-01-29: qty 1

## 2016-01-29 NOTE — Discharge Instructions (Signed)
Abdominal Pain, Adult °Many things can cause abdominal pain. Usually, abdominal pain is not caused by a disease and will improve without treatment. It can often be observed and treated at home. Your health care provider will do a physical exam and possibly order blood tests and X-rays to help determine the seriousness of your pain. However, in many cases, more time must pass before a clear cause of the pain can be found. Before that point, your health care provider may not know if you need more testing or further treatment. °HOME CARE INSTRUCTIONS °Monitor your abdominal pain for any changes. The following actions may help to alleviate any discomfort you are experiencing: °· Only take over-the-counter or prescription medicines as directed by your health care provider. °· Do not take laxatives unless directed to do so by your health care provider. °· Try a clear liquid diet (broth, tea, or water) as directed by your health care provider. Slowly move to a bland diet as tolerated. °SEEK MEDICAL CARE IF: °· You have unexplained abdominal pain. °· You have abdominal pain associated with nausea or diarrhea. °· You have pain when you urinate or have a bowel movement. °· You experience abdominal pain that wakes you in the night. °· You have abdominal pain that is worsened or improved by eating food. °· You have abdominal pain that is worsened with eating fatty foods. °· You have a fever. °SEEK IMMEDIATE MEDICAL CARE IF: °· Your pain does not go away within 2 hours. °· You keep throwing up (vomiting). °· Your pain is felt only in portions of the abdomen, such as the right side or the left lower portion of the abdomen. °· You pass bloody or black tarry stools. °MAKE SURE YOU: °· Understand these instructions. °· Will watch your condition. °· Will get help right away if you are not doing well or get worse. °  °This information is not intended to replace advice given to you by your health care provider. Make sure you discuss  any questions you have with your health care provider. °  °Document Released: 07/18/2005 Document Revised: 06/29/2015 Document Reviewed: 06/17/2013 °Elsevier Interactive Patient Education ©2016 [employer] ° °Nausea and Vomiting °Nausea is a sick feeling that often comes before throwing up (vomiting). Vomiting is a reflex where stomach contents come out of your mouth. Vomiting can cause severe loss of body fluids (dehydration). Children and elderly adults can become dehydrated quickly, especially if they also have diarrhea. Nausea and vomiting are symptoms of a condition or disease. It is important to find the cause of your symptoms. °CAUSES  °· Direct irritation of the stomach lining. This irritation can result from increased acid production (gastroesophageal reflux disease), infection, food poisoning, taking certain medicines (such as nonsteroidal anti-inflammatory drugs), alcohol use, or tobacco use. °· Signals from the brain. These signals could be caused by a headache, heat exposure, an inner ear disturbance, increased pressure in the brain from injury, infection, a tumor, or a concussion, pain, emotional stimulus, or metabolic problems. °· An obstruction in the gastrointestinal tract (bowel obstruction). °· Illnesses such as diabetes, hepatitis, gallbladder problems, appendicitis, kidney problems, cancer, sepsis, atypical symptoms of a heart attack, or eating disorders. °· Medical treatments such as chemotherapy and radiation. °· Receiving medicine that makes you sleep (general anesthetic) during surgery. °DIAGNOSIS °Your caregiver may ask for tests to be done if the problems do not improve after a few days. Tests may also be done if symptoms are severe or if the reason for the   nausea and vomiting is not clear. Tests may include: °· Urine tests. °· Blood tests. °· Stool tests. °· Cultures (to look for evidence of infection). °· X-rays or other imaging studies. °Test results can help your caregiver make  decisions about treatment or the need for additional tests. °TREATMENT °You need to stay well hydrated. Drink frequently but in small amounts. You may wish to drink water, sports drinks, clear broth, or eat frozen ice pops or gelatin dessert to help stay hydrated. When you eat, eating slowly may help prevent nausea. There are also some antinausea medicines that may help prevent nausea. °HOME CARE INSTRUCTIONS  °· Take all medicine as directed by your caregiver. °· If you do not have an appetite, do not force yourself to eat. However, you must continue to drink fluids. °· If you have an appetite, eat a normal diet unless your caregiver tells you differently. °¨ Eat a variety of complex carbohydrates (rice, wheat, potatoes, bread), lean meats, yogurt, fruits, and vegetables. °¨ Avoid high-fat foods because they are more difficult to digest. °· Drink enough water and fluids to keep your urine clear or pale yellow. °· If you are dehydrated, ask your caregiver for specific rehydration instructions. Signs of dehydration may include: °¨ Severe thirst. °¨ Dry lips and mouth. °¨ Dizziness. °¨ Dark urine. °¨ Decreasing urine frequency and amount. °¨ Confusion. °¨ Rapid breathing or pulse. °SEEK IMMEDIATE MEDICAL CARE IF:  °· You have blood or brown flecks (like coffee grounds) in your vomit. °· You have black or bloody stools. °· You have a severe headache or stiff neck. °· You are confused. °· You have severe abdominal pain. °· You have chest pain or trouble breathing. °· You do not urinate at least once every 8 hours. °· You develop cold or clammy skin. °· You continue to vomit for longer than 24 to 48 hours. °· You have a fever. °MAKE SURE YOU:  °· Understand these instructions. °· Will watch your condition. °· Will get help right away if you are not doing well or get worse. °  °This information is not intended to replace advice given to you by your health care provider. Make sure you discuss any questions you have with  your health care provider. °  °Document Released: 10/08/2005 Document Revised: 12/31/2011 Document Reviewed: 03/07/2011 °Elsevier Interactive Patient Education ©2016 [employer] ° °

## 2016-01-29 NOTE — ED Notes (Signed)
Notified pt of need for urine sample; pt states that she is unable to urinate at this time

## 2016-01-29 NOTE — ED Notes (Signed)
Pt states that she has had lower abdominal pain and N/V/D x 4 days. Alert and oriented.

## 2016-01-29 NOTE — ED Notes (Signed)
Bed: [id number] Expected date:  Expected time:  Means of arrival:  Comments: Can use room, no monitor

## 2016-01-29 NOTE — ED Provider Notes (Signed)
CSN: [id number]     Arrival date & time 01/29/16  1923 History   First MD Initiated Contact with Patient 01/29/16 1934     Chief Complaint  Patient presents with  . Abdominal Pain  . Emesis     (Consider location/radiation/quality/duration/timing/severity/associated sxs/prior Treatment) HPI Comments: Patient presents to the emergency department with chief complaint of nausea, and vomiting. She reports associated abdominal discomfort. She attributes this to persistent vomiting. She states that all of her family members are sick with the same symptoms. She did not do flu shot this year. She denies any fevers or chills, but does state that she has had body aches. She has tried taking TheraFlu with no relief. She states that she feels dehydrated. She denies any dysuria, vaginal discharge, or bleeding. Denies drinking any alcohol. Past abdominal surgeries include cholecystectomy. There are no modifying factors.  The history is provided by the patient. No language interpreter was used.    Past Medical History  Diagnosis Date  . No pertinent past medical history    Past Surgical History  Procedure Laterality Date  . Cholecystectomy    . Mandible fracture surgery     Family History  Problem Relation Age of Onset  . Hypertension Mother   . Kidney disease Mother    Social History  Substance Use Topics  . Smoking status: Never Smoker   . Smokeless tobacco: Never Used  . Alcohol Use: No   OB History    Gravida Para Term Preterm AB TAB SAB Ectopic Multiple Living   1 1 1       1      Review of Systems  Constitutional: Negative for fever and chills.  Respiratory: Negative for shortness of breath.   Cardiovascular: Negative for chest pain.  Gastrointestinal: Positive for nausea, vomiting and abdominal pain. Negative for diarrhea and constipation.  Genitourinary: Negative for dysuria.  All other systems reviewed and are negative.     Allergies  Review of patient's allergies  indicates no known allergies.  Home Medications   Prior to Admission medications   Not on File   BP 121/74 mmHg  Pulse 62  Temp(Src) 97.8 F (36.6 C) (Oral)  Resp 20  SpO2 100%  LMP 01/22/2016 (Approximate) Physical Exam  Constitutional: She is oriented to person, place, and time. She appears well-developed and well-nourished.  HENT:  Head: Normocephalic and atraumatic.  Eyes: Conjunctivae and EOM are normal. Pupils are equal, round, and reactive to light.  Neck: Normal range of motion. Neck supple.  Cardiovascular: Normal rate and regular rhythm.  Exam reveals no gallop and no friction rub.   No murmur heard. Pulmonary/Chest: Effort normal and breath sounds normal. No respiratory distress. She has no wheezes. She has no rales. She exhibits no tenderness.  Abdominal: Soft. Bowel sounds are normal. She exhibits no distension and no mass. There is no tenderness. There is no rebound and no guarding.  No focal abdominal tenderness, no RLQ tenderness or pain at McBurney's point, no RUQ tenderness or Murphy's sign, no left-sided abdominal tenderness, no fluid wave, or signs of peritonitis   Musculoskeletal: Normal range of motion. She exhibits no edema or tenderness.  Neurological: She is alert and oriented to person, place, and time.  Skin: Skin is warm and dry.  Psychiatric: She has a normal mood and affect. Her behavior is normal. Judgment and thought content normal.  Nursing note and vitals reviewed.   ED Course  Procedures (including critical care time) Results for orders placed or  performed during the hospital encounter of 01/29/16  Lipase, blood  Result Value Ref Range   Lipase 44 11 - 51 U/L  Comprehensive metabolic panel  Result Value Ref Range   Sodium 142 135 - 145 mmol/L   Potassium 3.4 (L) 3.5 - 5.1 mmol/L   Chloride 113 (H) 101 - 111 mmol/L   CO2 22 22 - 32 mmol/L   Glucose, Bld 102 (H) 65 - 99 mg/dL   BUN 12 6 - 20 mg/dL   Creatinine, Ser 1.30 0.44 - 1.00 mg/dL    Calcium 9.1 8.9 - 86.5 mg/dL   Total Protein 6.9 6.5 - 8.1 g/dL   Albumin 3.9 3.5 - 5.0 g/dL   AST 30 15 - 41 U/L   ALT 53 14 - 54 U/L   Alkaline Phosphatase 99 38 - 126 U/L   Total Bilirubin 0.2 (L) 0.3 - 1.2 mg/dL   GFR calc non Af Amer >60 >60 mL/min   GFR calc Af Amer >60 >60 mL/min   Anion gap 7 5 - 15  CBC  Result Value Ref Range   WBC 7.1 4.0 - 10.5 K/uL   RBC 4.85 3.87 - 5.11 MIL/uL   Hemoglobin 15.5 (H) 12.0 - 15.0 g/dL   HCT 78.4 69.6 - 29.5 %   MCV 94.2 78.0 - 100.0 fL   MCH 32.0 26.0 - 34.0 pg   MCHC 33.9 30.0 - 36.0 g/dL   RDW 28.4 13.2 - 44.0 %   Platelets 389 150 - 400 K/uL  Urinalysis, Routine w reflex microscopic (not at [facility])  Result Value Ref Range   Color, Urine YELLOW YELLOW   APPearance CLOUDY (A) CLEAR   Specific Gravity, Urine 1.041 (H) 1.005 - 1.030   pH 5.5 5.0 - 8.0   Glucose, UA NEGATIVE NEGATIVE mg/dL   Hgb urine dipstick NEGATIVE NEGATIVE   Bilirubin Urine NEGATIVE NEGATIVE   Ketones, ur NEGATIVE NEGATIVE mg/dL   Protein, ur NEGATIVE NEGATIVE mg/dL   Nitrite NEGATIVE NEGATIVE   Leukocytes, UA NEGATIVE NEGATIVE  I-Stat beta hCG blood, ED ([facility], [facility], [facility] only)  Result Value Ref Range   I-stat hCG, quantitative <5.0 <5 mIU/mL   Comment 3           No results found.  I have personally reviewed and evaluated these images and lab results as part of my medical decision-making.   EKG Interpretation None      MDM   Final diagnoses:  Non-intractable vomiting with nausea, vomiting of unspecified type  Generalized abdominal pain    Patient with nausea and vomiting, and diffuse abdominal discomfort, but there is no tenderness/focal tenderness on exam. I suspect that abdominal discomfort is from the vomiting/retching.  Will give fluids, treat symptoms, and check labs.  Likely viral given that all of patient's family members are sick with the same.  VSS.  No focal tenderness, doubt appy, no evidence of surgical abdomen.  9:39 PM Patient  reassessed, states that she is still nauseated, but has not had any vomiting.  Abdomen reassessed.  No focal tenderness.  Laboratory workup was fairly are assuring, no leukocytosis. Urinalysis is still pending. Urine pregnancy test negative.  Patient informs nurse that she is ready for discharge.  Tolerating drinking her sprite.  Will discharge to home with antiemetics.  Patient is stable and ready for discharge.   Roxy Horseman, PA-C 01/29/16 2358  Lorre Nick, MD 02/01/16 [phone]

## 2016-01-30 ENCOUNTER — Encounter (HOSPITAL_COMMUNITY): Payer: Self-pay | Admitting: Emergency Medicine

## 2016-01-30 ENCOUNTER — Emergency Department (HOSPITAL_COMMUNITY)
Admission: EM | Admit: 2016-01-30 | Discharge: 2016-01-30 | Disposition: A | Discharge status: Home / Self Care | Payer: Managed Care, Other (non HMO) | Source: Home / Self Care | Attending: Emergency Medicine | Admitting: Emergency Medicine

## 2016-01-30 DIAGNOSIS — R202 Paresthesia of skin: Secondary | ICD-10-CM | POA: Insufficient documentation

## 2016-01-30 DIAGNOSIS — R200 Anesthesia of skin: Secondary | ICD-10-CM

## 2016-01-30 DIAGNOSIS — F329 Major depressive disorder, single episode, unspecified: Secondary | ICD-10-CM

## 2016-01-30 DIAGNOSIS — R112 Nausea with vomiting, unspecified: Secondary | ICD-10-CM

## 2016-01-30 DIAGNOSIS — R109 Unspecified abdominal pain: Secondary | ICD-10-CM

## 2016-01-30 DIAGNOSIS — R531 Weakness: Secondary | ICD-10-CM | POA: Insufficient documentation

## 2016-01-30 LAB — CBG MONITORING, ED: Glucose-Capillary: 78 mg/dL (ref 65–99)

## 2016-01-30 MED ORDER — ONDANSETRON 4 MG PO TBDP
4.0000 mg | ORAL_TABLET | Freq: Once | ORAL | Status: AC | PRN
  Administered 2016-01-30: 4 mg via ORAL
  Filled 2016-01-30: qty 1

## 2016-01-30 NOTE — ED Notes (Signed)
Md discontinued labs due to patient had labs yesterday.

## 2016-01-30 NOTE — ED Notes (Signed)
Pt states she has had numbness in both sides of her face, both arms, and her hands since last Thursday. Pt states she was seen here yesterday and was told it was viral. Pt states she has had emesis since last Thursday as well. While getting pt's blood pressure, her arm became flexed and tight and pt stated she was unable to bend it. Pt states she is unsure the last time her calcium levels were checked. Pt states she is unable to move her arms, but has equal sensation on both sides. Pt has symmetry in her face and has good strength in both legs.

## 2016-01-30 NOTE — Discharge Instructions (Signed)
Paresthesia Paresthesia is an abnormal burning or prickling sensation. This sensation is generally felt in the hands, arms, legs, or feet. However, it may occur in any part of the body. Usually, it is not painful. The feeling may be described as:  Tingling or numbness.  Pins and needles.  Skin crawling.  Buzzing.  Limbs falling asleep.  Itching. Most people experience temporary (transient) paresthesia at some time in their lives. Paresthesia may occur when you breathe too quickly (hyperventilation). It can also occur without any apparent cause. Commonly, paresthesia occurs when pressure is placed on a nerve. The sensation quickly goes away after the pressure is removed. For some people, however, paresthesia is a long-lasting (chronic) condition that is caused by an underlying disorder. If you continue to have paresthesia, you may need further medical evaluation. HOME CARE INSTRUCTIONS Watch your condition for any changes. Taking the following actions may help to lessen any discomfort that you are feeling:  Avoid drinking alcohol.  Try acupuncture or massage to help relieve your symptoms.  Keep all follow-up visits as directed by your health care provider. This is important. SEEK MEDICAL CARE IF:  You continue to have episodes of paresthesia.  Your burning or prickling feeling gets worse when you walk.  You have pain, cramps, or dizziness.  You develop a rash. SEEK IMMEDIATE MEDICAL CARE IF:  You feel weak.  You have trouble walking or moving.  You have problems with speech, understanding, or vision.  You feel confused.  You cannot control your bladder or bowel movements.  You have numbness after an injury.  You faint.   This information is not intended to replace advice given to you by your health care provider. Make sure you discuss any questions you have with your health care provider.   Document Released: 09/28/2002 Document Revised: 02/22/2015 Document Reviewed:  10/04/2014 Elsevier Interactive Patient Education 2016 Elsevier Inc.  

## 2016-01-30 NOTE — ED Notes (Signed)
Lab delay - pt wants IV/labs done at the same time.

## 2016-01-30 NOTE — ED Provider Notes (Signed)
CSN: [id number]     Arrival date & time 01/30/16  1612 History   First MD Initiated Contact with Patient 01/30/16 1821     Chief Complaint  Patient presents with  . Numbness  . Weakness     (Consider location/radiation/quality/duration/timing/severity/associated sxs/prior Treatment) HPI Comments: 35 year old female who presents with numbness. The patient presented here yesterday for reports of nausea and vomiting as well as abdominal discomfort. Lab work was reassuring and patient was discharged home. The patient presents today for numbness which she states is intermittent and involves both sides of her face, both arms, and both hands. This intermittent numbness has been occurring since she started feeling ill 4 days ago. She feels generally weak but denies any focal weakness. No headache, visual changes, or fevers. When asked what seems to bring the numbness on her make it worse, she states that the numbness is worse when she feels bad and is associated with some feeling of being short of breath.  Patient is a 35 y.o. female presenting with weakness. The history is provided by the patient.  Weakness    Past Medical History  Diagnosis Date  . No pertinent past medical history    Past Surgical History  Procedure Laterality Date  . Cholecystectomy    . Mandible fracture surgery     Family History  Problem Relation Age of Onset  . Hypertension Mother   . Kidney disease Mother    Social History  Substance Use Topics  . Smoking status: Never Smoker   . Smokeless tobacco: Never Used  . Alcohol Use: No   OB History    Gravida Para Term Preterm AB TAB SAB Ectopic Multiple Living   1 1 1       1      Review of Systems  Neurological: Positive for weakness.   10 Systems reviewed and are negative for acute change except as noted in the HPI.    Allergies  Review of patient's allergies indicates no known allergies.  Home Medications   Prior to Admission medications    Medication Sig Start Date End Date Taking? Authorizing Provider  acetaminophen (TYLENOL) 500 MG tablet Take 1,000 mg by mouth every 6 (six) hours as needed for mild pain, moderate pain or headache.   Yes Historical Provider, MD  naproxen sodium (ANAPROX) 220 MG tablet Take 440 mg by mouth every 12 (twelve) hours as needed (pain).   Yes Historical Provider, MD  Phenyleph-Doxylamine-DM-APAP (ALKA-SELTZER PLS NIGHT CLD/FLU PO) Take 1 each by mouth every 8 (eight) hours as needed (cold symptoms).   Yes Historical Provider, MD  promethazine (PHENERGAN) 25 MG tablet Take 1 tablet (25 mg total) by mouth every 6 (six) hours as needed for nausea or vomiting. 01/29/16   [name], PA-C   Pulse 61  Temp(Src) 97.8 F (36.6 C) (Oral)  Resp 16  Ht 5' 5.5" (1.664 m)  Wt 150 lb (68.04 kg)  BMI 24.57 kg/m2  SpO2 100%  LMP 01/22/2016 (Approximate) Physical Exam  Constitutional: She is oriented to person, place, and time. She appears well-developed and well-nourished. No distress.  HENT:  Head: Normocephalic and atraumatic.  Moist mucous membranes  Eyes: Conjunctivae and EOM are normal. Pupils are equal, round, and reactive to light.  Neck: Neck supple.  Cardiovascular: Normal rate, regular rhythm and normal heart sounds.   No murmur heard. Pulmonary/Chest: Effort normal and breath sounds normal.  Abdominal: Soft. Bowel sounds are normal. She exhibits no distension. There is no tenderness.  Musculoskeletal:  She exhibits no edema.  Neurological: She is alert and oriented to person, place, and time. She has normal reflexes. No cranial nerve deficit. Coordination normal.  Fluent speech, 4/5 strength BUE and 5/5 strength BLE which seems effort-dependent; normal finger to nose testing, negative pronator drift, downgoing Babinski's; normal sensation throughout  Skin: Skin is warm and dry.  Psychiatric: Judgment normal.  Depressed mood  Nursing note and vitals reviewed.   ED Course  Procedures  (including critical care time) Labs Review Labs Reviewed  CBG MONITORING, ED     EKG Interpretation   Date/Time:  Monday January 30 2016 16:48:25 EDT Ventricular Rate:  71 PR Interval:  147 QRS Duration: 99 QT Interval:  429 QTC Calculation: 466 R Axis:   82 Text Interpretation:  Sinus rhythm Borderline T abnormalities, anterior  leads Baseline wander in lead(s) V5 borderline T wave inversion V3, no  other significant changes Confirmed by LITTLE MD, RACHEL ([id number]) on  01/30/2016 6:22:51 PM      MDM   Final diagnoses:  Numbness and tingling   Patient presents with 4 days of intermittent numbness and tingling involving her face, arms, and hands. She states that she has been having any symptoms in the setting of some vomiting and feeling unwell. On exam, she was awake and alert, in no acute distress. Vital signs unremarkable. Normal neurologic exam. She had some effort dependent weakness that was symmetric in both arms and stated that strength testing was difficult because she felt weak all over. She has no neurologic deficits to suggest intracranial process as the cause of her symptoms. Given her associated symptoms, her numbness may be related to anxiety attacks. I reviewed her lab work from yesterday which showed normal calcium level and potassium 3.4 which I discussed with her as far as dietary supplementation. EKG with no significant changes from previous. I discussed supportive care and emphasized importance of PCP follow-up within 1 week to ensure resolution of symptoms. I extensively reviewed return precautions including headache or visual changes. Patient voiced understanding and was discharged in satisfactory condition.   Laurence Spates, MD 01/30/16 [phone]

## 2016-01-30 NOTE — ED Notes (Signed)
Pt cannot use restroom at this time, aware urine specimen is needed.  

## 2016-09-19 ENCOUNTER — Emergency Department (HOSPITAL_COMMUNITY)
Admission: EM | Admit: 2016-09-19 | Discharge: 2016-09-19 | Disposition: A | Discharge status: Home / Self Care | Payer: Managed Care, Other (non HMO) | Attending: Emergency Medicine | Admitting: Emergency Medicine

## 2016-09-19 ENCOUNTER — Encounter (HOSPITAL_COMMUNITY): Payer: Self-pay | Admitting: Emergency Medicine

## 2016-09-19 DIAGNOSIS — R197 Diarrhea, unspecified: Secondary | ICD-10-CM

## 2016-09-19 DIAGNOSIS — Z79899 Other long term (current) drug therapy: Secondary | ICD-10-CM | POA: Insufficient documentation

## 2016-09-19 DIAGNOSIS — R112 Nausea with vomiting, unspecified: Secondary | ICD-10-CM | POA: Diagnosis present

## 2016-09-19 DIAGNOSIS — R1084 Generalized abdominal pain: Secondary | ICD-10-CM | POA: Diagnosis not present

## 2016-09-19 DIAGNOSIS — E876 Hypokalemia: Secondary | ICD-10-CM | POA: Diagnosis not present

## 2016-09-19 LAB — CBC
HEMATOCRIT: 48.6 % — AB (ref 36.0–46.0)
HEMOGLOBIN: 16.6 g/dL — AB (ref 12.0–15.0)
MCH: 32.2 pg (ref 26.0–34.0)
MCHC: 34.2 g/dL (ref 30.0–36.0)
MCV: 94.2 fL (ref 78.0–100.0)
Platelets: 526 10*3/uL — ABNORMAL HIGH (ref 150–400)
RBC: 5.16 MIL/uL — ABNORMAL HIGH (ref 3.87–5.11)
RDW: 13.5 % (ref 11.5–15.5)
WBC: 9.6 10*3/uL (ref 4.0–10.5)

## 2016-09-19 LAB — URINE MICROSCOPIC-ADD ON

## 2016-09-19 LAB — POC OCCULT BLOOD, ED: Fecal Occult Bld: POSITIVE — AB

## 2016-09-19 LAB — URINALYSIS, ROUTINE W REFLEX MICROSCOPIC
Glucose, UA: NEGATIVE mg/dL
KETONES UR: 15 mg/dL — AB
NITRITE: NEGATIVE
PH: 5.5 (ref 5.0–8.0)
PROTEIN: NEGATIVE mg/dL
Specific Gravity, Urine: 1.026 (ref 1.005–1.030)

## 2016-09-19 LAB — COMPREHENSIVE METABOLIC PANEL
ALK PHOS: 70 U/L (ref 38–126)
ALT: 68 U/L — ABNORMAL HIGH (ref 14–54)
ANION GAP: 13 (ref 5–15)
AST: 43 U/L — ABNORMAL HIGH (ref 15–41)
Albumin: 4.1 g/dL (ref 3.5–5.0)
BILIRUBIN TOTAL: 0.4 mg/dL (ref 0.3–1.2)
BUN: 7 mg/dL (ref 6–20)
CALCIUM: 9.3 mg/dL (ref 8.9–10.3)
CO2: 19 mmol/L — ABNORMAL LOW (ref 22–32)
Chloride: 106 mmol/L (ref 101–111)
Creatinine, Ser: 0.91 mg/dL (ref 0.44–1.00)
GFR calc Af Amer: >60 mL/min (ref >60)
Glucose, Bld: 96 mg/dL (ref 65–99)
POTASSIUM: 3.1 mmol/L — AB (ref 3.5–5.1)
Sodium: 138 mmol/L (ref 135–145)
TOTAL PROTEIN: 7.2 g/dL (ref 6.5–8.1)

## 2016-09-19 LAB — I-STAT BETA HCG BLOOD, ED (MC, WL, AP ONLY): I-stat hCG, quantitative: <5 m[IU]/mL (ref <5)

## 2016-09-19 LAB — HCG, QUANTITATIVE, PREGNANCY

## 2016-09-19 MED ORDER — CIPROFLOXACIN HCL 500 MG PO TABS: 500.0000 mg | ORAL_TABLET | Freq: Two times a day (BID) | ORAL | 0 refills | Status: DC

## 2016-09-19 MED ORDER — ONDANSETRON HCL 4 MG PO TABS: 4.0000 mg | ORAL_TABLET | Freq: Four times a day (QID) | ORAL | 0 refills | Status: DC

## 2016-09-19 MED ORDER — KETOROLAC TROMETHAMINE 15 MG/ML IJ SOLN
15.0000 mg | Freq: Once | INTRAMUSCULAR | Status: AC
  Administered 2016-09-19: 15 mg via INTRAVENOUS
  Filled 2016-09-19: qty 1

## 2016-09-19 MED ORDER — SODIUM CHLORIDE 0.9 % IV BOLUS (SEPSIS): 1000.0000 mL | Freq: Once | INTRAVENOUS | Status: DC

## 2016-09-19 MED ORDER — POTASSIUM CHLORIDE CRYS ER 20 MEQ PO TBCR
40.0000 meq | EXTENDED_RELEASE_TABLET | Freq: Once | ORAL | Status: DC
  Filled 2016-09-19: qty 2

## 2016-09-19 MED ORDER — SODIUM CHLORIDE 0.9 % IV BOLUS (SEPSIS)
1000.0000 mL | Freq: Once | INTRAVENOUS | Status: AC
  Administered 2016-09-19: 1000 mL via INTRAVENOUS

## 2016-09-19 MED ORDER — HALOPERIDOL LACTATE 5 MG/ML IJ SOLN
1.0000 mg | Freq: Once | INTRAMUSCULAR | Status: AC
  Administered 2016-09-19: 1 mg via INTRAVENOUS
  Filled 2016-09-19: qty 1

## 2016-09-19 NOTE — ED Triage Notes (Signed)
Pt. reports persistent emesis with diarrhea , chills and body aches for several days .

## 2016-09-19 NOTE — ED Notes (Signed)
ED Provider at bedside. 

## 2016-09-19 NOTE — ED Provider Notes (Signed)
[facility]-EMERGENCY DEPT Provider Note   CSN: [id number] Arrival date & time: 09/19/16  0341     History   Chief Complaint Chief Complaint  Patient presents with  . Emesis  . Diarrhea    HPI Maria Huynh is a 35 y.o. female.  HPI Patient presents with 3 day history of sudden onset, Persistent, worsening N/V/D with associated generalized abdominal pain.  She reports upwards of 25 episodes of emesis and diarrhea.  No hematemesis.  She does endorse bloody stools described as bright red. No melena.  No fever, chills, CP, SOB, flank pain, urinary symptoms, or vaginal complaints.  She has taken Tylenol without relief.  She states everyone in her family has been sick with the same symptoms after a meat contest this past weekend, but they have all improved.  She denies any alcohol use.  She does smoke marijuana, last use this morning.  Prior abdominal surgeries include cholecystectomy. She denies any recent travel.   Past Medical History:  Diagnosis Date  . No pertinent past medical history     Patient Active Problem List   Diagnosis Date Noted  . Hypotension 06/18/2012  . NSVD (normal spontaneous vaginal delivery) 09/21/2011    Past Surgical History:  Procedure Laterality Date  . CHOLECYSTECTOMY    . MANDIBLE FRACTURE SURGERY      OB History    Gravida Para Term Preterm AB Living   1 1 1     1    SAB TAB Ectopic Multiple Live Births           1       Home Medications    Prior to Admission medications   Medication Sig Start Date End Date Taking? Authorizing Provider  acetaminophen (TYLENOL) 500 MG tablet Take 1,000 mg by mouth every 6 (six) hours as needed for mild pain, moderate pain or headache.   Yes Historical Provider, MD  naproxen sodium (ANAPROX) 220 MG tablet Take 440 mg by mouth every 12 (twelve) hours as needed (pain).   Yes Historical Provider, MD  Phenyleph-Doxylamine-DM-APAP (ALKA-SELTZER PLS NIGHT CLD/FLU PO) Take 1 each by mouth every 8 (eight) hours as  needed (cold symptoms).   Yes Historical Provider, MD  promethazine (PHENERGAN) 25 MG tablet Take 1 tablet (25 mg total) by mouth every 6 (six) hours as needed for nausea or vomiting. 01/29/16  Yes [name], PA-C  ciprofloxacin (CIPRO) 500 MG tablet Take 1 tablet (500 mg total) by mouth 2 (two) times daily. 09/19/16   Shakeira Rhee, PA-C  ondansetron (ZOFRAN) 4 MG tablet Take 1 tablet (4 mg total) by mouth every 6 (six) hours. 09/19/16   Cheri FowlerKayla Dalyla Chui, PA-C    Family History Family History  Problem Relation Age of Onset  . Hypertension Mother   . Kidney disease Mother     Social History Social History  Substance Use Topics  . Smoking status: Never Smoker  . Smokeless tobacco: Never Used  . Alcohol use No     Allergies   Patient has no known allergies.   Review of Systems Review of Systems All other systems negative unless otherwise stated in HPI   Physical Exam Updated Vital Signs BP 112/68   Pulse 92   Temp 98.1 F (36.7 C) (Oral)   Resp 18   Ht 5' 5.5" (1.664 m)   Wt 70.3 kg   LMP 08/28/2016 (Approximate)   SpO2 100%   BMI 25.40 kg/m   Physical Exam  Constitutional: She is oriented to person, place,  and time. She appears well-developed and well-nourished.  Non-toxic appearance. She does not have a sickly appearance. She does not appear ill.  HENT:  Head: Normocephalic and atraumatic.  Mouth/Throat: Oropharynx is clear and moist. Mucous membranes are dry.  Eyes: Conjunctivae are normal. Pupils are equal, round, and reactive to light.  Neck: Normal range of motion. Neck supple.  Cardiovascular: Normal rate and regular rhythm.   Pulmonary/Chest: Effort normal and breath sounds normal. No accessory muscle usage or stridor. No respiratory distress. She has no wheezes. She has no rhonchi. She has no rales.  Abdominal: Soft. Bowel sounds are normal. She exhibits no distension. There is tenderness. There is no rebound and no guarding.  Genitourinary: Rectal exam shows  guaiac positive stool.  Genitourinary Comments: Chaperone present during exam.  No fissures.  Non-thrombosed external hemorrhoid at the 7 o'clock position.  No palpable stool in the rectal vault.  No gross blood.   Musculoskeletal: Normal range of motion.  Lymphadenopathy:    She has no cervical adenopathy.  Neurological: She is alert and oriented to person, place, and time.  Speech clear without dysarthria.  Skin: Skin is warm and dry.  Psychiatric: She has a normal mood and affect. Her behavior is normal.     ED Treatments / Results  Labs (all labs ordered are listed, but only abnormal results are displayed) Labs Reviewed  COMPREHENSIVE METABOLIC PANEL - Abnormal; Notable for the following:       Result Value   Potassium 3.1 (*)    CO2 19 (*)    AST 43 (*)    ALT 68 (*)    All other components within normal limits  CBC - Abnormal; Notable for the following:    RBC 5.16 (*)    Hemoglobin 16.6 (*)    HCT 48.6 (*)    Platelets 526 (*)    All other components within normal limits  URINALYSIS, ROUTINE W REFLEX MICROSCOPIC (NOT AT [facility]) - Abnormal; Notable for the following:    Color, Urine AMBER (*)    APPearance TURBID (*)    Hgb urine dipstick LARGE (*)    Bilirubin Urine SMALL (*)    Ketones, ur 15 (*)    Leukocytes, UA SMALL (*)    All other components within normal limits  URINE MICROSCOPIC-ADD ON - Abnormal; Notable for the following:    Squamous Epithelial / LPF 0-5 (*)    Bacteria, UA MANY (*)    All other components within normal limits  POC OCCULT BLOOD, ED - Abnormal; Notable for the following:    Fecal Occult Bld POSITIVE (*)    All other components within normal limits  URINE CULTURE  HCG, QUANTITATIVE, PREGNANCY  I-STAT BETA HCG BLOOD, ED ([facility], [facility], [facility] ONLY)    EKG  EKG Interpretation None       Radiology No results found.  Procedures Procedures (including critical care time)  Medications Ordered in ED Medications  potassium chloride SA  (K-DUR,KLOR-CON) CR tablet 40 mEq (40 mEq Oral Not Given 09/19/16 0739)  sodium chloride 0.9 % bolus 1,000 mL (1,000 mLs Intravenous Not Given 09/19/16 0817)  sodium chloride 0.9 % bolus 1,000 mL (0 mLs Intravenous Stopped 09/19/16 0817)  haloperidol lactate (HALDOL) injection 1 mg (1 mg Intravenous Given 09/19/16 9604)  ketorolac (TORADOL) 15 MG/ML injection 15 mg (15 mg Intravenous Given 09/19/16 0739)     Initial Impression / Assessment and Plan / ED Course  I have reviewed the triage vital signs and the  nursing notes.  Pertinent labs & imaging results that were available during my care of the patient were reviewed by me and considered in my medical decision making (see chart for details).  Clinical Course    Patient presents with persistent N/V/D after family meat contest, numerous sick contacts with similar symptoms.  Also, recent marijuana use.  On exam, patient appears non-toxic or ill.  Her mucous membranes appear dry.  Abdomen is soft with generalized tenderness without rebound, guarding, or rigidity.  Labs largely without acute abnormalities aside from mild hypokalemia, 3.1, likely due to persistent N/V/D.  UA without obvious infection, she has no urinary symptoms, this has been sent for culture, at this time no indication for abx.  I have low suspicion for acute abdomen, imaging is not warranted at this time.  Likely viral or infectious given sick contacts with similar symptoms vs cyclical vomiting syndrome 2/2 cannabis use.  Will give IVF and Haldol and reassess.   Fecal occult positive, this favors infectious diarrhea.  Do not suspect HUS. Plan to treat empirically with cipro.   Patient complaining of body aches, tolerating ice chips without difficulty.  Will give PO potassium and toradol.   Patient states she has to leave now because her friend has things to do. She refused PO potassium, stating the pills were too large, patient will not stay for IV.  Discussed risks of hypokalemia  and offered prescription for PO K and she refused. This is likely 2/2 to N/V/D.  She feels improved and has not had any episodes of vomiting while in ED and has been able to tolerate PO.  Plan to treat for possible infectious diarrhea with Cipro.  Abdominal exam is benign and I have low suspicion for diverticulitis or mesenteric ischemia.  Consider GI bleed, but hemoglobin is stable.  Home with Zofran. At this time, I feel patient may be discharged. Discussed strict return precautions including worsening symptoms, continued vomiting and diarrhea, CP, muscle cramps, confusion, or any new or concerning symptoms.  Patient agrees and acknowledges and has no further questions.  Stable for discharge.   Final Clinical Impressions(s) / ED Diagnoses   Final diagnoses:  Nausea vomiting and diarrhea  Generalized abdominal pain  Hypokalemia    New Prescriptions New Prescriptions   CIPROFLOXACIN (CIPRO) 500 MG TABLET    Take 1 tablet (500 mg total) by mouth 2 (two) times daily.   ONDANSETRON (ZOFRAN) 4 MG TABLET    Take 1 tablet (4 mg total) by mouth every 6 (six) hours.     Cheri FowlerKayla Nyair Depaulo, PA-C 09/19/16 [id number]    Alvira MondayErin Schlossman, MD 09/20/16 [phone]

## 2016-09-19 NOTE — ED Notes (Signed)
Pt states, "I have to go because my ride has to leave now." K. Rose, [place], at bedside.

## 2016-09-19 NOTE — ED Notes (Signed)
Pt reports need to leave, does not want to stay for additional IV fluids.  Provider notified and is at bedside.

## 2016-09-20 LAB — URINE CULTURE

## 2016-11-21 ENCOUNTER — Encounter (HOSPITAL_COMMUNITY): Payer: Self-pay | Admitting: Emergency Medicine

## 2016-11-21 ENCOUNTER — Emergency Department (HOSPITAL_COMMUNITY): Payer: Managed Care, Other (non HMO)

## 2016-11-21 ENCOUNTER — Emergency Department (HOSPITAL_COMMUNITY)
Admission: EM | Admit: 2016-11-21 | Discharge: 2016-11-21 | Disposition: A | Discharge status: Home / Self Care | Payer: Managed Care, Other (non HMO) | Attending: Emergency Medicine | Admitting: Emergency Medicine

## 2016-11-21 DIAGNOSIS — R109 Unspecified abdominal pain: Secondary | ICD-10-CM | POA: Diagnosis present

## 2016-11-21 DIAGNOSIS — N201 Calculus of ureter: Secondary | ICD-10-CM

## 2016-11-21 LAB — COMPREHENSIVE METABOLIC PANEL
ALBUMIN: 4.1 g/dL (ref 3.5–5.0)
ALT: 15 U/L (ref 14–54)
AST: 21 U/L (ref 15–41)
Alkaline Phosphatase: 52 U/L (ref 38–126)
Anion gap: 9 (ref 5–15)
BUN: 9 mg/dL (ref 6–20)
CHLORIDE: 107 mmol/L (ref 101–111)
CO2: 21 mmol/L — ABNORMAL LOW (ref 22–32)
Calcium: 9 mg/dL (ref 8.9–10.3)
Creatinine, Ser: 0.82 mg/dL (ref 0.44–1.00)
GFR calc Af Amer: >60 mL/min (ref >60)
GFR calc non Af Amer: >60 mL/min (ref >60)
GLUCOSE: 124 mg/dL — AB (ref 65–99)
POTASSIUM: 3 mmol/L — AB (ref 3.5–5.1)
SODIUM: 137 mmol/L (ref 135–145)
Total Bilirubin: 0.2 mg/dL — ABNORMAL LOW (ref 0.3–1.2)
Total Protein: 6.8 g/dL (ref 6.5–8.1)

## 2016-11-21 LAB — CBC WITH DIFFERENTIAL/PLATELET
BASOS ABS: 0.1 10*3/uL (ref 0.0–0.1)
Basophils Relative: 1 %
EOS PCT: 2 %
Eosinophils Absolute: 0.2 10*3/uL (ref 0.0–0.7)
HCT: 42.8 % (ref 36.0–46.0)
Hemoglobin: 14.3 g/dL (ref 12.0–15.0)
Lymphocytes Relative: 31 %
Lymphs Abs: 3.6 10*3/uL (ref 0.7–4.0)
MCH: 32.1 pg (ref 26.0–34.0)
MCHC: 33.4 g/dL (ref 30.0–36.0)
MCV: 96.2 fL (ref 78.0–100.0)
MONO ABS: 0.8 10*3/uL (ref 0.1–1.0)
Monocytes Relative: 7 %
Neutro Abs: 7 10*3/uL (ref 1.7–7.7)
Neutrophils Relative %: 59 %
PLATELETS: 554 10*3/uL — AB (ref 150–400)
RBC: 4.45 MIL/uL (ref 3.87–5.11)
RDW: 13.5 % (ref 11.5–15.5)
WBC: 11.6 10*3/uL — AB (ref 4.0–10.5)

## 2016-11-21 LAB — URINALYSIS, ROUTINE W REFLEX MICROSCOPIC
BILIRUBIN URINE: NEGATIVE
Glucose, UA: NEGATIVE mg/dL
KETONES UR: NEGATIVE mg/dL
Nitrite: NEGATIVE
PH: 5 (ref 5.0–8.0)
Protein, ur: NEGATIVE mg/dL
Specific Gravity, Urine: 1.019 (ref 1.005–1.030)

## 2016-11-21 LAB — POC URINE PREG, ED: PREG TEST UR: NEGATIVE

## 2016-11-21 MED ORDER — ONDANSETRON HCL 4 MG/2ML IJ SOLN
4.0000 mg | Freq: Once | INTRAMUSCULAR | Status: AC
  Administered 2016-11-21: 4 mg via INTRAVENOUS
  Filled 2016-11-21: qty 2

## 2016-11-21 MED ORDER — SODIUM CHLORIDE 0.9 % IV BOLUS (SEPSIS)
1000.0000 mL | Freq: Once | INTRAVENOUS | Status: AC
  Administered 2016-11-21: 1000 mL via INTRAVENOUS

## 2016-11-21 MED ORDER — MORPHINE SULFATE (PF) 4 MG/ML IV SOLN
4.0000 mg | Freq: Once | INTRAVENOUS | Status: AC
  Administered 2016-11-21: 4 mg via INTRAVENOUS
  Filled 2016-11-21: qty 1

## 2016-11-21 MED ORDER — FENTANYL CITRATE (PF) 100 MCG/2ML IJ SOLN
50.0000 ug | INTRAMUSCULAR | Status: DC | PRN
  Administered 2016-11-21: 50 ug via INTRAVENOUS
  Filled 2016-11-21: qty 2

## 2016-11-21 MED ORDER — KETOROLAC TROMETHAMINE 30 MG/ML IJ SOLN
30.0000 mg | Freq: Once | INTRAMUSCULAR | Status: AC
  Administered 2016-11-21: 30 mg via INTRAVENOUS
  Filled 2016-11-21: qty 1

## 2016-11-21 MED ORDER — HYDROMORPHONE HCL 1 MG/ML IJ SOLN
1.0000 mg | Freq: Once | INTRAMUSCULAR | Status: DC | PRN
Start: 2016-11-21 — End: 2016-11-21
  Filled 2016-11-21: qty 1

## 2016-11-21 MED ORDER — OXYCODONE-ACETAMINOPHEN 5-325 MG PO TABS: 1.0000 | ORAL_TABLET | ORAL | 0 refills | Status: DC | PRN

## 2016-11-21 MED ORDER — TAMSULOSIN HCL 0.4 MG PO CAPS: 0.4000 mg | ORAL_CAPSULE | Freq: Two times a day (BID) | ORAL | 0 refills | Status: DC

## 2016-11-21 MED ORDER — ONDANSETRON HCL 4 MG PO TABS: 4.0000 mg | ORAL_TABLET | Freq: Three times a day (TID) | ORAL | 0 refills | Status: DC | PRN

## 2016-11-21 NOTE — ED Triage Notes (Signed)
Pt is c/o right flank pain that woke her up out of her sleep  Pt states the pain is sharp  Pt has active nausea and vomiting in triage

## 2016-11-21 NOTE — ED Notes (Signed)
Received bedside report from LeawoodAbby, [place]

## 2016-11-21 NOTE — Discharge Instructions (Signed)
Return to the ED immediately if you develop fever, uncontrolled pain or vomiting, or other concerns. ° °

## 2016-11-21 NOTE — ED Notes (Signed)
Bed: WA24 Expected date:  Expected time:  Means of arrival:  Comments: Room 4 

## 2016-11-21 NOTE — ED Provider Notes (Signed)
[facility]-EMERGENCY DEPT Provider Note   CSN: [id number] Arrival date & time: 11/21/16  0000  By signing my name below, I, Maria Huynh, attest that this documentation has been prepared under the direction and in the presence of Arthor CaptainAbigail Isamar Wellbrock, PA-C. Electronically Signed: Lyndon CodeMaurice Huynh Jr., ED Scribe. 11/21/16. 4:39 AM.   History   Chief Complaint Chief Complaint  Patient presents with  . Flank Pain    HPI  Maria Huynh is a 36 y.o. female with hx of cholecystomy who presents to the Emergency Department complaining of severe R flank pain with sudden onset x2 hours. Pt states that she awoke from sleep with a sharp R flank pain and associated vomiting. Pt has taken Morphine with mild relief. She denies urinary symptoms. Pt reports family hx of kidney stones (mother). She denies hx of kidney stones. Of note, pt just concluded her monthly menstrual.   The history is provided by the patient. No language interpreter was used.    Past Medical History:  Diagnosis Date  . No pertinent past medical history     Patient Active Problem List   Diagnosis Date Noted  . Hypotension 06/18/2012  . NSVD (normal spontaneous vaginal delivery) 09/21/2011    Past Surgical History:  Procedure Laterality Date  . CHOLECYSTECTOMY    . MANDIBLE FRACTURE SURGERY      OB History    Gravida Para Term Preterm AB Living   1 1 1     1    SAB TAB Ectopic Multiple Live Births           1       Home Medications    Prior to Admission medications   Medication Sig Start Date End Date Taking? Authorizing Provider  acetaminophen (TYLENOL) 500 MG tablet Take 1,000 mg by mouth every 6 (six) hours as needed for mild pain, moderate pain or headache.   Yes Historical Provider, MD  ciprofloxacin (CIPRO) 500 MG tablet Take 1 tablet (500 mg total) by mouth 2 (two) times daily. Patient not taking: Reported on 11/21/2016 09/19/16   [name], PA-C  ondansetron (ZOFRAN) 4 MG tablet Take 1 tablet (4 mg  total) by mouth every 8 (eight) hours as needed for nausea or vomiting. 11/21/16   Arthor CaptainAbigail Farzad Tibbetts, PA-C  oxyCODONE-acetaminophen (PERCOCET) 5-325 MG tablet Take 1-2 tablets by mouth every 4 (four) hours as needed. 11/21/16   Arthor CaptainAbigail Raekwon Winkowski, PA-C  promethazine (PHENERGAN) 25 MG tablet Take 1 tablet (25 mg total) by mouth every 6 (six) hours as needed for nausea or vomiting. Patient not taking: Reported on 11/21/2016 01/29/16   [name], PA-C  tamsulosin (FLOMAX) 0.4 MG CAPS capsule Take 1 capsule (0.4 mg total) by mouth 2 (two) times daily. 11/21/16   Arthor CaptainAbigail Ethell Blatchford, PA-C    Family History Family History  Problem Relation Age of Onset  . Hypertension Mother   . Kidney disease Mother     Social History Social History  Substance Use Topics  . Smoking status: Never Smoker  . Smokeless tobacco: Never Used  . Alcohol use No     Allergies   Patient has no known allergies.   Review of Systems Review of Systems  Gastrointestinal: Positive for nausea and vomiting.  Genitourinary: Positive for flank pain (R). Negative for dysuria, hematuria and urgency.    A complete 10 system review of systems was obtained and all systems are negative except as noted in the HPI and PMH.    Physical Exam Updated Vital Signs BP 101/70 (  BP Location: Left Arm)   Pulse 72   Temp 98.3 F (36.8 C) (Oral)   Resp 15   Ht 5\' 5"  (1.651 m)   Wt 67.8 kg   LMP 11/14/2016 (Approximate) Comment: negative urine pregnancy test 11/21/16  SpO2 99%   BMI 24.89 kg/m   Physical Exam  Constitutional: She is oriented to person, place, and time. She appears well-developed and well-nourished. No distress.  HENT:  Head: Normocephalic and atraumatic.  Eyes: Conjunctivae are normal. No scleral icterus.  Neck: Normal range of motion. Neck supple.  Cardiovascular: Normal rate, regular rhythm and normal heart sounds.  Exam reveals no gallop and no friction rub.   No murmur heard. Pulmonary/Chest: Effort normal and  breath sounds normal. No respiratory distress.  Abdominal: Soft. Bowel sounds are normal. She exhibits no distension and no mass. There is no tenderness. There is no guarding.  No cva tenderness  Musculoskeletal: She exhibits no edema.  Neurological: She is alert and oriented to person, place, and time.  Skin: Skin is warm and dry. She is not diaphoretic.  Psychiatric: She has a normal mood and affect.  Nursing note and vitals reviewed.    ED Treatments / Results   DIAGNOSTIC STUDIES: Oxygen Saturation is 100% on RA, normal by my interpretation.   COORDINATION OF CARE: 4:39 AM-Discussed next steps with pt. Pt verbalized understanding and is agreeable with the plan.    Labs (all labs ordered are listed, but only abnormal results are displayed) Labs Reviewed  URINALYSIS, ROUTINE W REFLEX MICROSCOPIC - Abnormal; Notable for the following:       Result Value   APPearance HAZY (*)    Hgb urine dipstick MODERATE (*)    Leukocytes, UA LARGE (*)    Bacteria, UA RARE (*)    Squamous Epithelial / LPF 6-30 (*)    All other components within normal limits  CBC WITH DIFFERENTIAL/PLATELET - Abnormal; Notable for the following:    WBC 11.6 (*)    Platelets 554 (*)    All other components within normal limits  COMPREHENSIVE METABOLIC PANEL - Abnormal; Notable for the following:    Potassium 3.0 (*)    CO2 21 (*)    Glucose, Bld 124 (*)    Total Bilirubin 0.2 (*)    All other components within normal limits  POC URINE PREG, ED    EKG  EKG Interpretation None       Radiology Ct Renal Stone Study  Result Date: 11/21/2016 CLINICAL DATA:  Right flank pain, awakened at midnight. EXAM: CT ABDOMEN AND PELVIS WITHOUT CONTRAST TECHNIQUE: Multidetector CT imaging of the abdomen and pelvis was performed following the standard protocol without IV contrast. COMPARISON:  None. FINDINGS: Lower chest: No acute abnormality. Hepatobiliary: No focal liver abnormality is seen. Status post  cholecystectomy. No biliary dilatation. Pancreas: Unremarkable. No pancreatic ductal dilatation or surrounding inflammatory changes. Spleen: Normal in size without focal abnormality. Adrenals/Urinary Tract: There is moderate hydronephrosis and hydroureter on the right, all way to the ureterovesical junction. At the UVJ there is a 2 mm calcification which is seen to best advantage on coronal image 60, series 4 and on sagittal image 60, series 5. On axial images it appears that the calcification might be just next in the ureteral orifice, perhaps extruded into the urinary bladder lumen. No other urinary calculi are evident. No significant parenchymal renal lesions. Urinary bladder is unremarkable. Both adrenals are unremarkable. Stomach/Bowel: Stomach is within normal limits. Appendix is normal. No evidence of  bowel wall thickening, distention, or inflammatory changes. Vascular/Lymphatic: No significant vascular findings are present. No enlarged abdominal or pelvic lymph nodes. Reproductive: Uterus and bilateral adnexa are unremarkable. IUD noted. Other: No ascites.  Small fat containing umbilical hernia. Musculoskeletal: No significant skeletal lesion. IMPRESSION: Moderate right-sided hydronephrosis and hydroureter, probably due to a 2 mm UVJ calculus. No other urinary calculi. No other acute findings. Electronically Signed   By: Ellery Plunk M.D.   On: 11/21/2016 02:50    Procedures Procedures (including critical care time)  Medications Ordered in ED Medications  fentaNYL (SUBLIMAZE) injection 50 mcg (50 mcg Intravenous Given 11/21/16 0035)  HYDROmorphone (DILAUDID) injection 1 mg (not administered)  ondansetron (ZOFRAN) injection 4 mg (4 mg Intravenous Given 11/21/16 0035)  morphine 4 MG/ML injection 4 mg (4 mg Intravenous Given 11/21/16 0112)  ondansetron (ZOFRAN) injection 4 mg (4 mg Intravenous Given 11/21/16 0112)  sodium chloride 0.9 % bolus 1,000 mL (0 mLs Intravenous Stopped 11/21/16 0414)    ketorolac (TORADOL) 30 MG/ML injection 30 mg (30 mg Intravenous Given 11/21/16 0411)     Initial Impression / Assessment and Plan / ED Course  I have reviewed the triage vital signs and the nursing notes.  Pertinent labs & imaging results that were available during my care of the patient were reviewed by me and considered in my medical decision making (see chart for details).     Pt has been diagnosed with a Kidney Stone via CT. There is no evidence of significant hydronephrosis, serum creatine WNL, vitals sign stable and the pt does not have irratractable vomiting. Pt will be dc home with pain medications & has been advised to follow up with PCP.   Final Clinical Impressions(s) / ED Diagnoses   Final diagnoses:  Ureteral calculus, right    New Prescriptions Discharge Medication List as of 11/21/2016  4:07 AM    START taking these medications   Details  oxyCODONE-acetaminophen (PERCOCET) 5-325 MG tablet Take 1-2 tablets by mouth every 4 (four) hours as needed., Starting Wed 11/21/2016, Print    tamsulosin (FLOMAX) 0.4 MG CAPS capsule Take 1 capsule (0.4 mg total) by mouth 2 (two) times daily., Starting Wed 11/21/2016, Print      I personally performed the services described in this documentation, which was scribed in my presence. The recorded information has been reviewed and is accurate.       Arthor Captain, PA-C 11/21/16 0440    Shon Baton, MD 11/21/16 [phone]

## 2016-12-06 ENCOUNTER — Emergency Department (HOSPITAL_COMMUNITY): Payer: Managed Care, Other (non HMO)

## 2016-12-06 ENCOUNTER — Emergency Department (HOSPITAL_COMMUNITY)
Admission: EM | Admit: 2016-12-06 | Discharge: 2016-12-06 | Disposition: A | Discharge status: Home / Self Care | Payer: Managed Care, Other (non HMO) | Attending: Emergency Medicine | Admitting: Emergency Medicine

## 2016-12-06 ENCOUNTER — Encounter (HOSPITAL_COMMUNITY): Payer: Self-pay | Admitting: Emergency Medicine

## 2016-12-06 DIAGNOSIS — R0789 Other chest pain: Secondary | ICD-10-CM | POA: Diagnosis not present

## 2016-12-06 DIAGNOSIS — Z79899 Other long term (current) drug therapy: Secondary | ICD-10-CM | POA: Diagnosis not present

## 2016-12-06 DIAGNOSIS — R079 Chest pain, unspecified: Secondary | ICD-10-CM | POA: Diagnosis present

## 2016-12-06 LAB — BASIC METABOLIC PANEL
Anion gap: 7 (ref 5–15)
BUN: 7 mg/dL (ref 6–20)
CHLORIDE: 110 mmol/L (ref 101–111)
CO2: 22 mmol/L (ref 22–32)
Calcium: 9.2 mg/dL (ref 8.9–10.3)
Creatinine, Ser: 0.64 mg/dL (ref 0.44–1.00)
Glucose, Bld: 107 mg/dL — ABNORMAL HIGH (ref 65–99)
POTASSIUM: 3.2 mmol/L — AB (ref 3.5–5.1)
SODIUM: 139 mmol/L (ref 135–145)

## 2016-12-06 LAB — CBC
HEMATOCRIT: 44.5 % (ref 36.0–46.0)
Hemoglobin: 15.4 g/dL — ABNORMAL HIGH (ref 12.0–15.0)
MCH: 32.2 pg (ref 26.0–34.0)
MCHC: 34.6 g/dL (ref 30.0–36.0)
MCV: 93.1 fL (ref 78.0–100.0)
PLATELETS: 459 10*3/uL — AB (ref 150–400)
RBC: 4.78 MIL/uL (ref 3.87–5.11)
RDW: 13.4 % (ref 11.5–15.5)
WBC: 7.8 10*3/uL (ref 4.0–10.5)

## 2016-12-06 LAB — I-STAT TROPONIN, ED: Troponin i, poc: 0 ng/mL (ref 0.00–0.08)

## 2016-12-06 MED ORDER — KETOROLAC TROMETHAMINE 15 MG/ML IJ SOLN
15.0000 mg | Freq: Once | INTRAMUSCULAR | Status: AC
  Administered 2016-12-06: 15 mg via INTRAVENOUS
  Filled 2016-12-06: qty 1

## 2016-12-06 NOTE — ED Provider Notes (Signed)
[facility]-EMERGENCY DEPT Provider Note   CSN: [id number] Arrival date & time: 12/06/16  1206     History   Chief Complaint Chief Complaint  Patient presents with  . Chest Pain    HPI Maria Huynh is a 36 y.o. female.  HPI    36 year old female presents today with complaints of chest pain.  Patient notes approximately 2 days ago she started to feel off and on sharp pain in her central chest.  She notes this radiates into bilateral breasts.  She notes this came out of the blue while she was sleeping, and has been coming and going.  She notes at the present moment she is having very minimal symptoms, no associated nausea, vomiting, shortness of breath.  Patient notes that yesterday she did have several episodes of vomiting and diarrhea, low-grade fever which resolved on its own.  She reports symptoms are not made worse with deep inspiration, activity, no aggravating factors noted.  Patient reports she smokes marijuana, uses no recreational drugs, does not smoke tobacco.  She denies any indigestion, abdominal pain, lower extremity swelling or edema.  Patient with no significant risk factors for DVT or PE.  No history of the same.  Patient reports dry nonproductive cough.    Past Medical History:  Diagnosis Date  . No pertinent past medical history     Patient Active Problem List   Diagnosis Date Noted  . Hypotension 06/18/2012  . NSVD (normal spontaneous vaginal delivery) 09/21/2011    Past Surgical History:  Procedure Laterality Date  . CHOLECYSTECTOMY    . MANDIBLE FRACTURE SURGERY      OB History    Gravida Para Term Preterm AB Living   1 1 1     1    SAB TAB Ectopic Multiple Live Births           1       Home Medications    Prior to Admission medications   Medication Sig Start Date End Date Taking? Authorizing Provider  acetaminophen (TYLENOL) 500 MG tablet Take 1,000 mg by mouth every 6 (six) hours as needed for mild pain, moderate pain or headache.   Yes  Historical Provider, MD  DM-Phenylephrine-Acetaminophen (ROBITUSSIN COLD+FLU DAYTIME PO) Take 30 mLs by mouth 2 (two) times daily as needed (for cough).   Yes Historical Provider, MD    Family History Family History  Problem Relation Age of Onset  . Hypertension Mother   . Kidney disease Mother     Social History Social History  Substance Use Topics  . Smoking status: Never Smoker  . Smokeless tobacco: Never Used  . Alcohol use No     Allergies   Patient has no known allergies.   Review of Systems Review of Systems  All other systems reviewed and are negative.    Physical Exam Updated Vital Signs BP 93/68 (BP Location: Right Arm)   Pulse 78   Temp 97.7 F (36.5 C) (Oral)   Resp 14   Ht 5\' 5"  (1.651 m)   Wt 65.8 kg   LMP 11/14/2016 (Approximate) Comment: negative urine pregnancy test 11/21/16  SpO2 100%   BMI 24.13 kg/m   Physical Exam  Constitutional: She is oriented to person, place, and time. She appears well-developed and well-nourished.  HENT:  Head: Normocephalic and atraumatic.  Eyes: Conjunctivae are normal. Pupils are equal, round, and reactive to light. Right eye exhibits no discharge. Left eye exhibits no discharge. No scleral icterus.  Neck: Normal range of motion.  No JVD present. No tracheal deviation present.  Cardiovascular: Normal rate, regular rhythm, normal heart sounds and intact distal pulses.  Exam reveals no gallop and no friction rub.   No murmur heard. Pulmonary/Chest: Effort normal and breath sounds normal. No stridor. No respiratory distress. She has no wheezes. She has no rales.  Sternum is exquisitely tender to palpation, minor tenderness to palpation of the bilateral anterior chest wall  Musculoskeletal: She exhibits no edema.  Neurological: She is alert and oriented to person, place, and time. Coordination normal.  Skin: Skin is warm.  Psychiatric: She has a normal mood and affect. Her behavior is normal. Judgment and thought  content normal.  Nursing note and vitals reviewed.    ED Treatments / Results  Labs (all labs ordered are listed, but only abnormal results are displayed) Labs Reviewed  BASIC METABOLIC PANEL - Abnormal; Notable for the following:       Result Value   Potassium 3.2 (*)    Glucose, Bld 107 (*)    All other components within normal limits  CBC - Abnormal; Notable for the following:    Hemoglobin 15.4 (*)    Platelets 459 (*)    All other components within normal limits  I-STAT TROPOININ, ED    EKG  EKG Interpretation  Date/Time:  Thursday December 06 2016 12:12:13 EST Ventricular Rate:  80 PR Interval:    QRS Duration: 96 QT Interval:  392 QTC Calculation: 453 R Axis:   77 Text Interpretation:  Sinus rhythm RSR' in V1 or V2, probably normal variant Nonspecific T abnormalities, anterior leads No significant change since last tracing Confirmed by Lincoln Brigham [phone]) on 12/06/2016 12:55:12 PM       Radiology Dg Chest 2 View  Result Date: 12/06/2016 CLINICAL DATA:  Mid chest pain intermittently for the past 2 days with increased symptoms today associated with cold sweats. Nonsmoker. EXAM: CHEST  2 VIEW COMPARISON:  PA and lateral chest x-ray of June 17, 2013 FINDINGS: The lungs are mildly hyperinflated. There is no focal infiltrate. There is no pleural effusion. The heart and pulmonary vascularity are normal. The mediastinum is normal in width. The trachea is midline. The bony thorax exhibits no acute abnormality. IMPRESSION: Mild hyperinflation suggests reactive airway disease or acute bronchitis. No pneumonia, CHF, nor other acute cardiopulmonary abnormality. Electronically Signed   By: David  [place] M.D.   On: 12/06/2016 13:05    Procedures Procedures (including critical care time)  Medications Ordered in ED Medications  ketorolac (TORADOL) 15 MG/ML injection 15 mg (15 mg Intravenous Given 12/06/16 1335)     Initial Impression / Assessment and Plan / ED Course  I have  reviewed the triage vital signs and the nursing notes.  Pertinent labs & imaging results that were available during my care of the patient were reviewed by me and considered in my medical decision making (see chart for details).       Final Clinical Impressions(s) / ED Diagnoses   Final diagnoses:  Chest wall pain    Labs: I-STAT troponin, BMP, CBC  Imaging: DG Chest 2 View  Consults:  Therapeutics: Toradol   Discharge Meds:   Assessment/Plan: 36 year old female presents today with complaints of chest pain.  Patient has reproducible chest pain with palpation of the chest wall.  She has a heart score of 1, PERC negative. I have very low concern for ACS/ PE or any other significant intrathoracic pathology.  Patient's pain was eliminated with Toradol here, she is well-appearing in  no acute distress.  She will be discharged home with primary care follow-up, strict return precautions.  She verbalized understanding and agreement to today's plan had no further questions or concerns at the time of discharge.      New Prescriptions New Prescriptions   No medications on file     Eyvonne MechanicJeffrey Keturah Yerby, PA-C 12/06/16 1456    Tilden FossaElizabeth Rees, MD 12/07/16 [phone]

## 2016-12-06 NOTE — Discharge Instructions (Signed)
Please read attached information. If you experience any new or worsening signs or symptoms please return to the emergency room for evaluation. Please follow-up with your primary care provider or specialist as discussed. Please use medication prescribed only as directed and discontinue taking if you have any concerning signs or symptoms.   °

## 2016-12-06 NOTE — ED Triage Notes (Signed)
Pt c/o mid chest pain worsening today, intermittent past 2 days. Pt reports pain radiating into breast. C/o cold sweats.

## 2018-10-08 IMAGING — CT CT RENAL STONE PROTOCOL
2 of 3 series · 16 of 46 positions shown, 18 images · non-contrast
Comparison: None.

CLINICAL DATA: Right flank pain, awakened at midnight.

EXAM:
CT ABDOMEN AND PELVIS WITHOUT CONTRAST
TECHNIQUE: Multidetector CT imaging of the abdomen and pelvis was performed
following the standard protocol without IV contrast.

[Series 3: lung · axial · 0.67mm/px · z∈[+1194,+1262]mm · 13 of 40 slices shown, 15 images]
[im 3/40  soft-tissue]
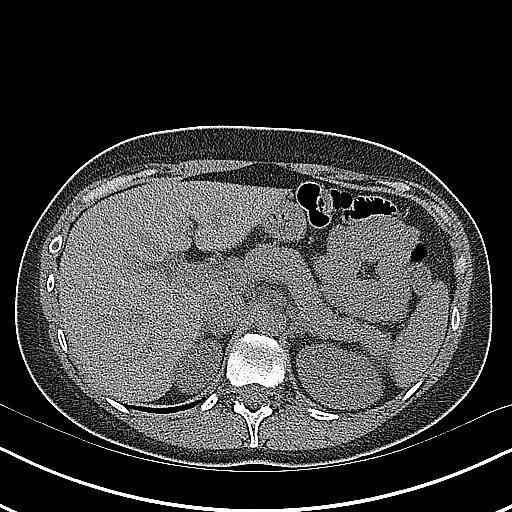
[im 3/40  bone]
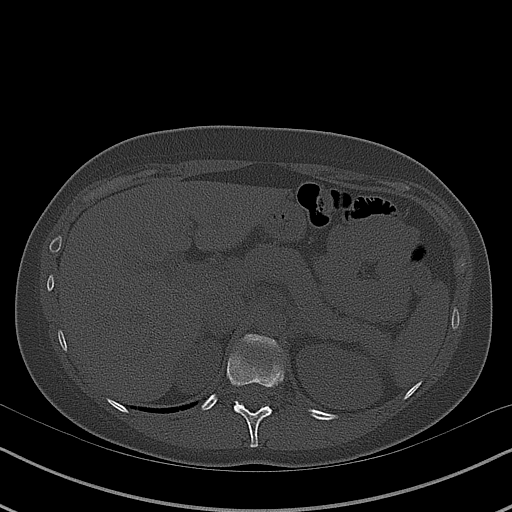
[im 6/40  soft-tissue]
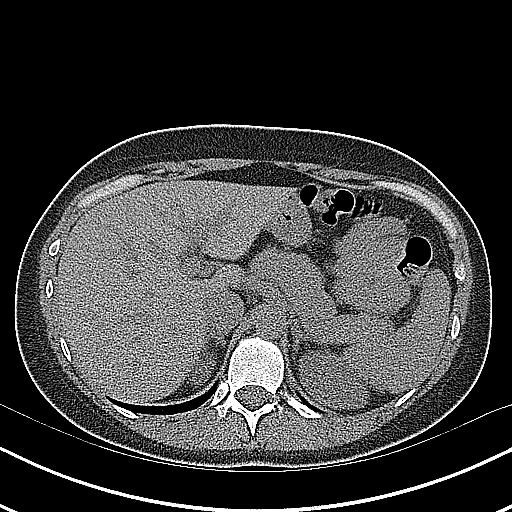
[im 8/40  soft-tissue]
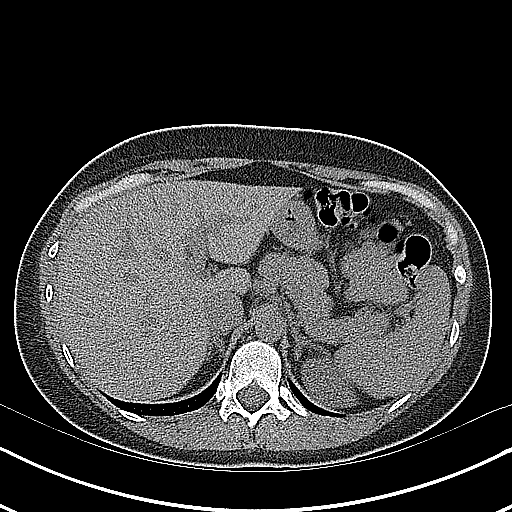
[im 12/40  soft-tissue]
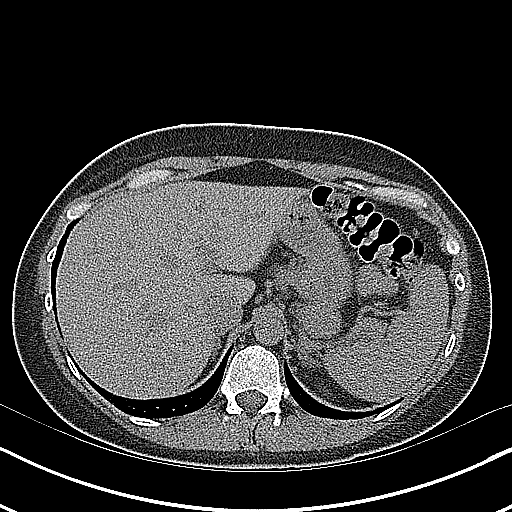
[im 14/40  soft-tissue]
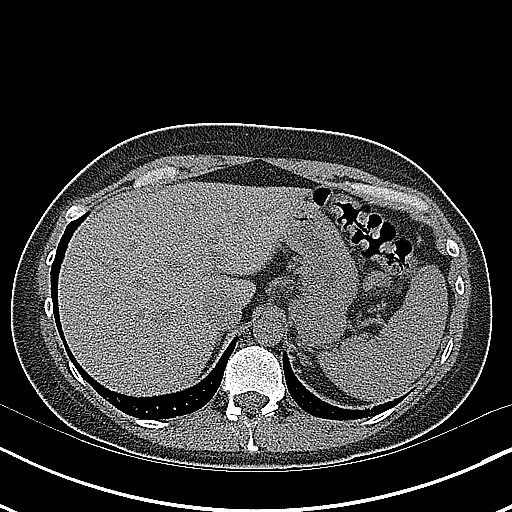
[im 17/40  soft-tissue]
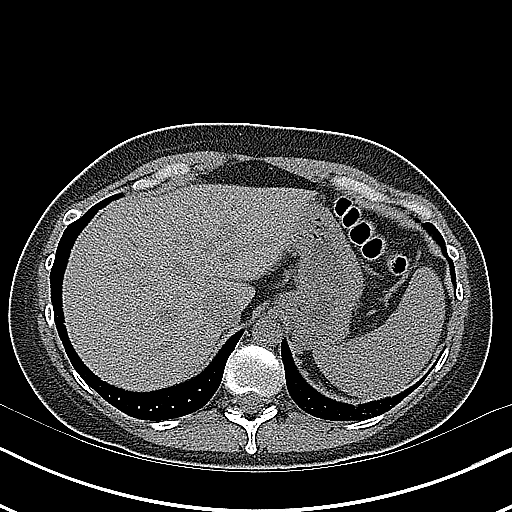
[im 21/40  soft-tissue]
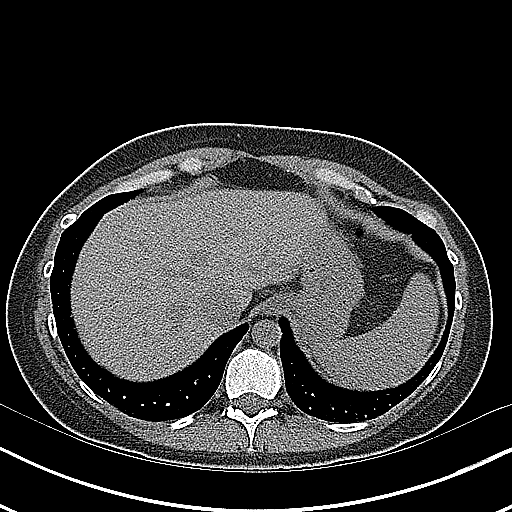
[im 23/40  soft-tissue]
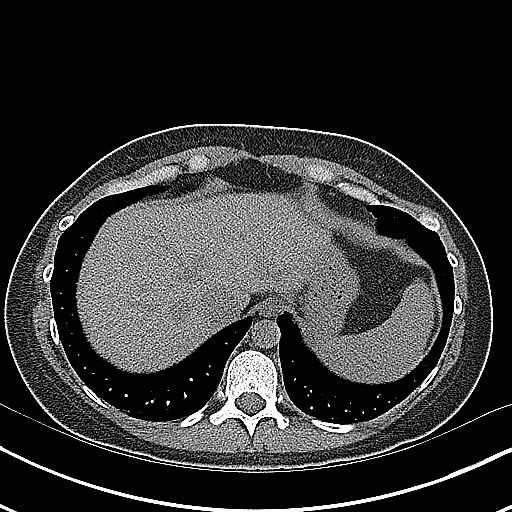
[im 26/40  soft-tissue]
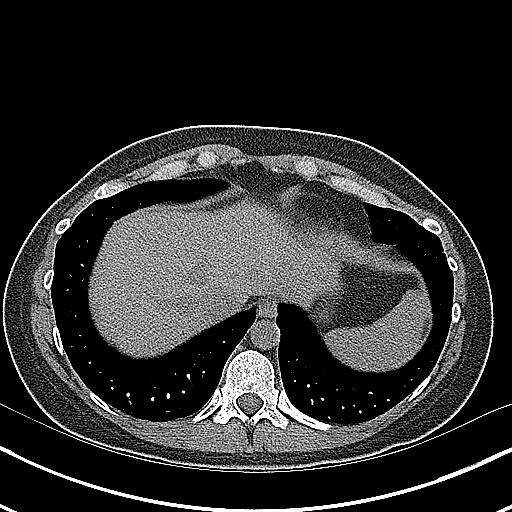
[im 26/40  bone]
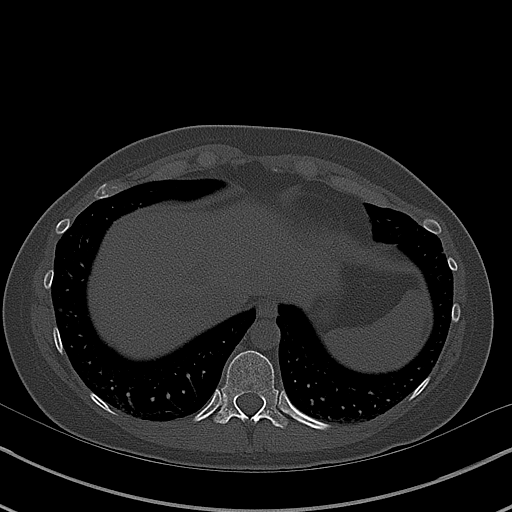
[im 28/40  soft-tissue]
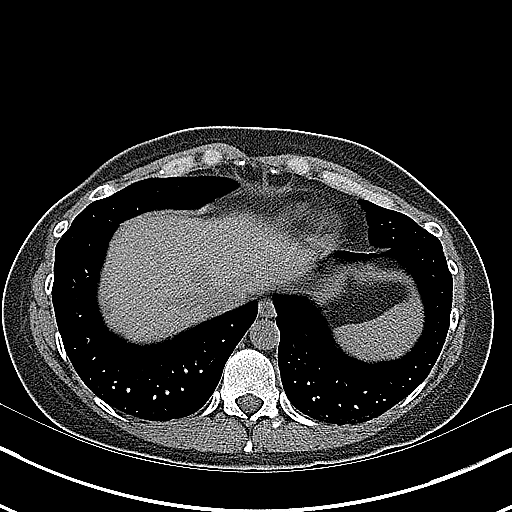
[im 32/40  soft-tissue]
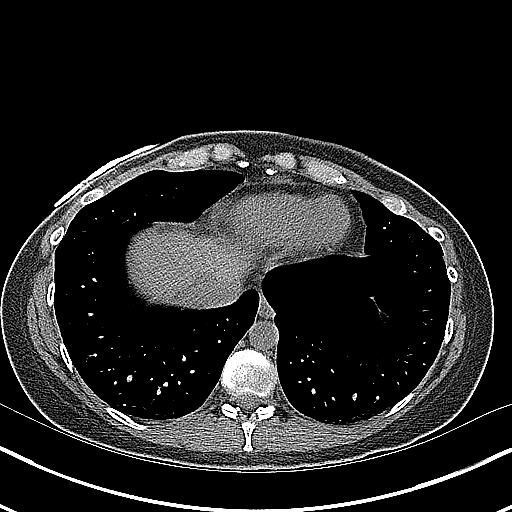
[im 34/40  soft-tissue]
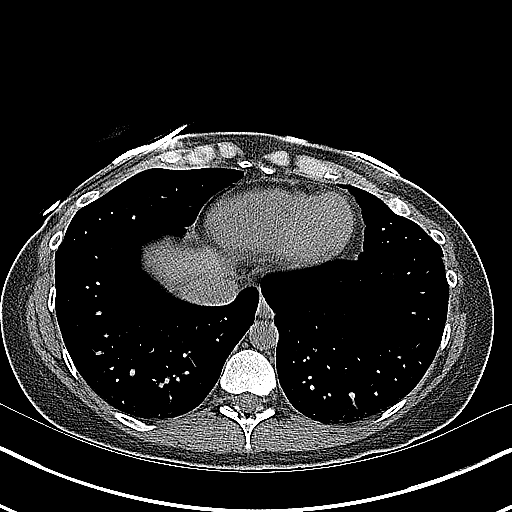
[im 37/40  soft-tissue]
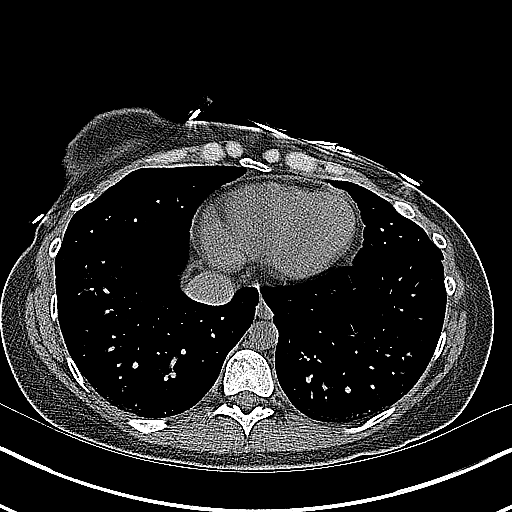

[Series 4: coronal · coronal · 0.60mm/px · 3 of 94 slices shown]
[im 32/94  soft-tissue]
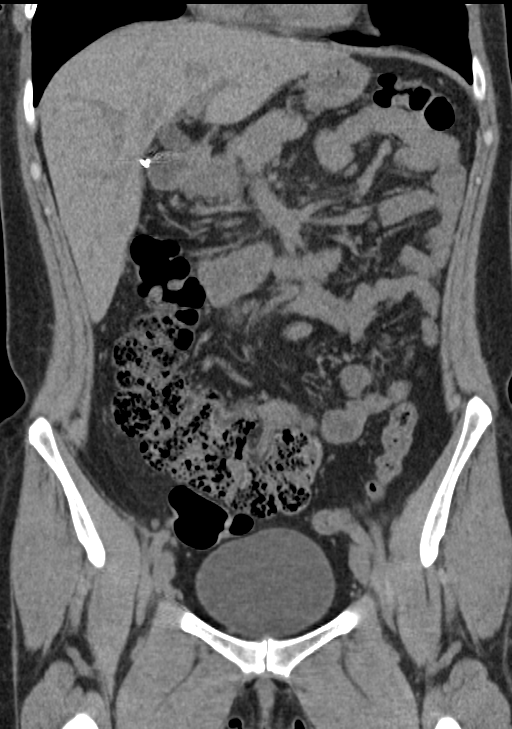
[im 42/94  soft-tissue]
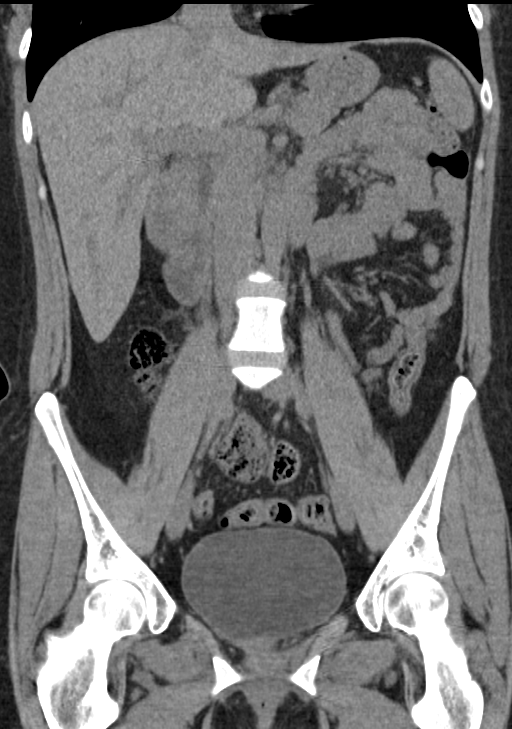
[im 52/94  soft-tissue]
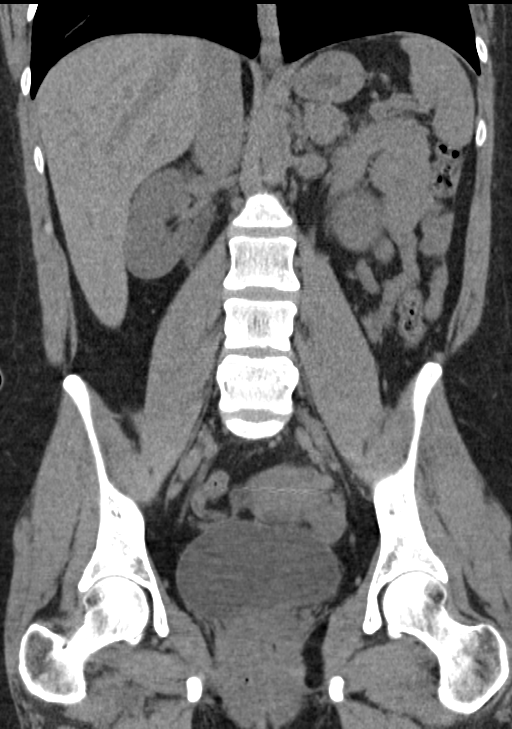

[16 of 46 positions shown; findings below may reference images not displayed]

FINDINGS: Lower chest: No acute abnormality.

Hepatobiliary: No focal liver abnormality is seen. Status post
cholecystectomy. No biliary dilatation.

Pancreas: Unremarkable. No pancreatic ductal dilatation or
surrounding inflammatory changes.

Spleen: Normal in size without focal abnormality.

Adrenals/Urinary Tract: There is moderate hydronephrosis and
hydroureter on the right, all way to the ureterovesical junction. At
the UVJ there is a 2 mm calcification which is seen to best
advantage on coronal image 60, series 4 and on sagittal image 60,
series 5. On axial images it appears that the calcification might be
just next in the ureteral orifice, perhaps extruded into the urinary
bladder lumen.

No other urinary calculi are evident. No significant parenchymal
renal lesions. Urinary bladder is unremarkable.

Both adrenals are unremarkable.

Stomach/Bowel: Stomach is within normal limits. Appendix is normal.
No evidence of bowel wall thickening, distention, or inflammatory
changes.

Vascular/Lymphatic: No significant vascular findings are present. No
enlarged abdominal or pelvic lymph nodes.

Reproductive: Uterus and bilateral adnexa are unremarkable. IUD
noted.

Other: No ascites.  Small fat containing umbilical hernia.

Musculoskeletal: No significant skeletal lesion.
IMPRESSION: Moderate right-sided hydronephrosis and hydroureter, probably due to
a 2 mm UVJ calculus. No other urinary calculi. No other acute
findings.

## 2022-06-04 ENCOUNTER — Other Ambulatory Visit: Payer: Self-pay

## 2022-06-04 ENCOUNTER — Encounter (HOSPITAL_BASED_OUTPATIENT_CLINIC_OR_DEPARTMENT_OTHER): Payer: Self-pay

## 2022-06-04 ENCOUNTER — Emergency Department (HOSPITAL_BASED_OUTPATIENT_CLINIC_OR_DEPARTMENT_OTHER): Payer: No Typology Code available for payment source | Admitting: Radiology

## 2022-06-04 ENCOUNTER — Emergency Department (HOSPITAL_BASED_OUTPATIENT_CLINIC_OR_DEPARTMENT_OTHER)
Admission: EM | Admit: 2022-06-04 | Discharge: 2022-06-04 | Disposition: A | Discharge status: Home / Self Care | Payer: No Typology Code available for payment source | Attending: Emergency Medicine | Admitting: Emergency Medicine

## 2022-06-04 DIAGNOSIS — S99922A Unspecified injury of left foot, initial encounter: Secondary | ICD-10-CM | POA: Diagnosis present

## 2022-06-04 DIAGNOSIS — W208XXA Other cause of strike by thrown, projected or falling object, initial encounter: Secondary | ICD-10-CM | POA: Insufficient documentation

## 2022-06-04 MED ORDER — ACETAMINOPHEN 325 MG PO TABS
650.0000 mg | ORAL_TABLET | Freq: Once | ORAL | Status: AC
  Administered 2022-06-04: 650 mg via ORAL
  Filled 2022-06-04: qty 2

## 2022-06-04 MED ORDER — IBUPROFEN 800 MG PO TABS
800.0000 mg | ORAL_TABLET | Freq: Once | ORAL | Status: AC
  Administered 2022-06-04: 800 mg via ORAL
  Filled 2022-06-04: qty 1

## 2022-06-04 NOTE — ED Provider Notes (Signed)
[facility] EMERGENCY DEPT Provider Note   CSN: [id number] Arrival date & time: 06/04/22  2003     History  Chief Complaint  Patient presents with   Foot Injury    Maria Huynh is a 41 y.o. female with noncontributory past medical history presents with concern for left foot pain after dropping a bed frame on her left foot just prior to arrival.  She does not take anything for pain at this time.  She reports that she was barely able to walk on it secondary to pain.  She reports the pain is radiating up her left shin into the knee.  She denies any numbness, tingling, bleeding.  Denies any other injuries, falling.  She does not take any blood thinners.   Foot Injury      Home Medications Prior to Admission medications   Medication Sig Start Date End Date Taking? Authorizing Provider  acetaminophen (TYLENOL) 500 MG tablet Take 1,000 mg by mouth every 6 (six) hours as needed for mild pain, moderate pain or headache.    [provider]  DM-Phenylephrine-Acetaminophen (ROBITUSSIN COLD+FLU DAYTIME PO) Take 30 mLs by mouth 2 (two) times daily as needed (for cough).    [provider]      Allergies    Patient has no known allergies.    Review of Systems   Review of Systems  All other systems reviewed and are negative.   Physical Exam Updated Vital Signs BP 122/83 (BP Location: Right Arm)   Pulse 89   Temp 97.7 F (36.5 C) (Oral)   Resp 18   Ht 5\' 5"  (1.651 m)   Wt 95.3 kg   LMP 05/23/2022 (Exact Date)   SpO2 100%   BMI 34.95 kg/m  Physical Exam Vitals and nursing note reviewed.  Constitutional:      General: She is not in acute distress.    Appearance: Normal appearance.  HENT:     Head: Normocephalic and atraumatic.  Eyes:     General:        Right eye: No discharge.        Left eye: No discharge.  Cardiovascular:     Rate and Rhythm: Normal rate and regular rhythm.  Pulmonary:     Effort: Pulmonary effort is normal. No  respiratory distress.  Musculoskeletal:        General: No deformity.     Comments: Soft tissue swelling noted to the lateral dorsum of the left foot.  No step-off or deformity noted.  She has intact range of motion to plantar and dorsiflexion of the left foot.  She can wiggle her toes without difficulty.  Ambulatory with difficulty.  Skin:    General: Skin is warm and dry.  Neurological:     Mental Status: She is alert and oriented to person, place, and time.  Psychiatric:        Mood and Affect: Mood normal.        Behavior: Behavior normal.     ED Results / Procedures / Treatments   Labs (all labs ordered are listed, but only abnormal results are displayed) Labs Reviewed - No data to display  EKG None  Radiology No results found.  Procedures Procedures    Medications Ordered in ED Medications  ibuprofen (ADVIL) tablet 800 mg (800 mg Oral Given 06/04/22 2202)  acetaminophen (TYLENOL) tablet 650 mg (650 mg Oral Given 06/04/22 2202)    ED Course/ Medical Decision Making/ A&P  Medical Decision Making Amount and/or Complexity of Data Reviewed Radiology: ordered.  Risk OTC drugs. Prescription drug management.   This is an overall well-appearing 41 year old female with no significant past medical history who presents with concern for acute left foot injury after dropping a bed frame on top of it.  Patient did not take anything for pain prior to arrival.  Members differential diagnosis includes acute fracture, dislocation, other midfoot injury, soft tissue bruising versus other.  I independently interpreted imaging including plain film x-ray of the left foot which shows no evidence of acute fracture, dislocation. I agree with the radiologist interpretation.  On assessment patient with neurovascularly intact left lower foot, she is having difficulty with ambulation secondary to pain.  She does have a fair amount of soft tissue swelling.  Given her  pain I think it would be reasonable to provide postop shoe and crutches for comfort, but discussed that she can return to normal walking as tolerated.  Encouraged RICE method.  Encourage follow-up with orthopedics if no significant improvement for possible repeat x-ray for occult midfoot fracture.  She is discharged in stable condition at this time, return precautions given. Final Clinical Impression(s) / ED Diagnoses Final diagnoses:  Foot injury, left, initial encounter    Rx / DC Orders ED Discharge Orders     None         Olene Floss, PA-C 06/04/22 2253    Melene Plan, DO 06/04/22 2306

## 2022-06-04 NOTE — ED Triage Notes (Signed)
Pt states she dropped a bed frame on top of left foot.  +swelling and bruising

## 2022-06-04 NOTE — Discharge Instructions (Signed)
Please use Tylenol or ibuprofen for pain.  You may use 600 mg ibuprofen every 6 hours or 1000 mg of Tylenol every 6 hours.  You may choose to alternate between the 2.  This would be most effective.  Not to exceed 4 g of Tylenol within 24 hours.  Not to exceed 3200 mg ibuprofen 24 hours.  As we discussed your x-ray did not show any evidence of fracture, dislocation or anything other than soft tissue bruising.  Occasionally these midfoot injuries are associated with a nondisplaced fracture that is nearly impossible to see on initial x-ray.  Neither myself nor radiologist on any evidence of a fracture, however if you are having no improvement of pain after 7 to 10 days I recommend following up with orthopedics for repeat x-ray to rule out occult fracture.  In the meantime you can use the crutches for a day or 2 to help until you are able to ambulate, and then return to ambulation is normal.  I recommend rest, ice, compression, elevation of the affected extremity.
